# Patient Record
Sex: Male | Born: 1985 | Race: White | Hispanic: No | Marital: Married | Smoking: Never smoker
Health system: Southern US, Community
[De-identification: ages and names within clinical notes are randomized; demographics above are authoritative.]

## PROBLEM LIST (undated history)

## (undated) DIAGNOSIS — T8859XA Other complications of anesthesia, initial encounter: Secondary | ICD-10-CM

## (undated) DIAGNOSIS — F909 Attention-deficit hyperactivity disorder, unspecified type: Secondary | ICD-10-CM

## (undated) DIAGNOSIS — T4145XA Adverse effect of unspecified anesthetic, initial encounter: Secondary | ICD-10-CM

## (undated) DIAGNOSIS — E785 Hyperlipidemia, unspecified: Secondary | ICD-10-CM

## (undated) HISTORY — PX: FACIAL RECONSTRUCTION SURGERY: SHX631

## (undated) HISTORY — PX: COLONOSCOPY: SHX174

## (undated) HISTORY — PX: WISDOM TOOTH EXTRACTION: SHX21

---

## 2011-12-12 LAB — LIPID PANEL
CHOLESTEROL: 217 mg/dL — AB (ref 0–200)
HDL: 43 mg/dL (ref 35–70)
LDL CALC: 158 mg/dL
Triglycerides: 79 mg/dL (ref 40–160)

## 2015-06-23 ENCOUNTER — Ambulatory Visit (INDEPENDENT_AMBULATORY_CARE_PROVIDER_SITE_OTHER): Payer: 59 | Admitting: Family Medicine

## 2015-06-23 ENCOUNTER — Encounter: Payer: Self-pay | Admitting: Family Medicine

## 2015-06-23 VITALS — BP 111/65 | HR 61 | Ht 69.0 in | Wt 183.0 lb

## 2015-06-23 DIAGNOSIS — E785 Hyperlipidemia, unspecified: Secondary | ICD-10-CM

## 2015-06-23 DIAGNOSIS — F909 Attention-deficit hyperactivity disorder, unspecified type: Secondary | ICD-10-CM | POA: Diagnosis not present

## 2015-06-23 MED ORDER — METHYLPHENIDATE HCL ER 27 MG PO TB24: 1.0000 | ORAL_TABLET | Freq: Every day | ORAL | Status: DC

## 2015-06-23 MED ORDER — PRAVASTATIN SODIUM 20 MG PO TABS: 20.0000 mg | ORAL_TABLET | Freq: Every day | ORAL | Status: DC

## 2015-06-23 NOTE — Patient Instructions (Signed)
Thank you for coming in today. Return in 3 months for recheck and fasting labs.  Good luck at work.   Attention Deficit Hyperactivity Disorder Attention deficit hyperactivity disorder (ADHD) is a problem with behavior issues based on the way the brain functions (neurobehavioral disorder). It is a common reason for behavior and academic problems in school. SYMPTOMS  There are 3 types of ADHD. The 3 types and some of the symptoms include:  Inattentive.  Gets bored or distracted easily.  Loses or forgets things. Forgets to hand in homework.  Has trouble organizing or completing tasks.  Difficulty staying on task.  An inability to organize daily tasks and school work.  Leaving projects, chores, or homework unfinished.  Trouble paying attention or responding to details. Careless mistakes.  Difficulty following directions. Often seems like is not listening.  Dislikes activities that require sustained attention (like chores or homework).  Hyperactive-impulsive.  Feels like it is impossible to sit still or stay in a seat. Fidgeting with hands and feet.  Trouble waiting turn.  Talking too much or out of turn. Interruptive.  Speaks or acts impulsively.  Aggressive, disruptive behavior.  Constantly busy or on the go; noisy.  Often leaves seat when they are expected to remain seated.  Often runs or climbs where it is not appropriate, or feels very restless.  Combined.  Has symptoms of both of the above. Often children with ADHD feel discouraged about themselves and with school. They often perform well below their abilities in school. As children get older, the excess motor activities can calm down, but the problems with paying attention and staying organized persist. Most children do not outgrow ADHD but with good treatment can learn to cope with the symptoms. DIAGNOSIS  When ADHD is suspected, the diagnosis should be made by professionals trained in ADHD. This professional  will collect information about the individual suspected of having ADHD. Information must be collected from various settings where the person lives, works, or attends school.  Diagnosis will include:  Confirming symptoms began in childhood.  Ruling out other reasons for the child's behavior.  The health care providers will check with the child's school and check their medical records.  They will talk to teachers and parents.  Behavior rating scales for the child will be filled out by those dealing with the child on a daily basis. A diagnosis is made only after all information has been considered. TREATMENT  Treatment usually includes behavioral treatment, tutoring or extra support in school, and stimulant medicines. Because of the way a person's brain works with ADHD, these medicines decrease impulsivity and hyperactivity and increase attention. This is different than how they would work in a person who does not have ADHD. Other medicines used include antidepressants and certain blood pressure medicines. Most experts agree that treatment for ADHD should address all aspects of the person's functioning. Along with medicines, treatment should include structured classroom management at school. Parents should reward good behavior, provide constant discipline, and set limits. Tutoring should be available for the child as needed. ADHD is a lifelong condition. If untreated, the disorder can have long-term serious effects into adolescence and adulthood. HOME CARE INSTRUCTIONS   Often with ADHD there is a lot of frustration among family members dealing with the condition. Blame and anger are also feelings that are common. In many cases, because the problem affects the family as a whole, the entire family may need help. A therapist can help the family find better ways to handle  the disruptive behaviors of the person with ADHD and promote change. If the person with ADHD is young, most of the therapist's work  is with the parents. Parents will learn techniques for coping with and improving their child's behavior. Sometimes only the child with the ADHD needs counseling. Your health care providers can help you make these decisions.  Children with ADHD may need help learning how to organize. Some helpful tips include:  Keep routines the same every day from wake-up time to bedtime. Schedule all activities, including homework and playtime. Keep the schedule in a place where the person with ADHD will often see it. Mark schedule changes as far in advance as possible.  Schedule outdoor and indoor recreation.  Have a place for everything and keep everything in its place. This includes clothing, backpacks, and school supplies.  Encourage writing down assignments and bringing home needed books. Work with your child's teachers for assistance in organizing school work.  Offer your child a well-balanced diet. Breakfast that includes a balance of whole grains, protein, and fruits or vegetables is especially important for school performance. Children should avoid drinks with caffeine including:  Soft drinks.  Coffee.  Tea.  However, some older children (adolescents) may find these drinks helpful in improving their attention. Because it can also be common for adolescents with ADHD to become addicted to caffeine, talk with your health care provider about what is a safe amount of caffeine intake for your child.  Children with ADHD need consistent rules that they can understand and follow. If rules are followed, give small rewards. Children with ADHD often receive, and expect, criticism. Look for good behavior and praise it. Set realistic goals. Give clear instructions. Look for activities that can foster success and self-esteem. Make time for pleasant activities with your child. Give lots of affection.  Parents are their children's greatest advocates. Learn as much as possible about ADHD. This helps you become a  stronger and better advocate for your child. It also helps you educate your child's teachers and instructors if they feel inadequate in these areas. Parent support groups are often helpful. A national group with local chapters is called Children and Adults with Attention Deficit Hyperactivity Disorder (CHADD). SEEK MEDICAL CARE IF:  Your child has repeated muscle twitches, cough, or speech outbursts.  Your child has sleep problems.  Your child has a marked loss of appetite.  Your child develops depression.  Your child has new or worsening behavioral problems.  Your child develops dizziness.  Your child has a racing heart.  Your child has stomach pains.  Your child develops headaches. SEEK IMMEDIATE MEDICAL CARE IF:  Your child has been diagnosed with depression or anxiety and the symptoms seem to be getting worse.  Your child has been depressed and suddenly appears to have increased energy or motivation.  You are worried that your child is having a bad reaction to a medication he or she is taking for ADHD. Document Released: 10/26/2002 Document Revised: 11/10/2013 Document Reviewed: 07/13/2013 [facility] Patient Information 2015 [place], [place]. This information is not intended to replace advice given to you by your health care provider. Make sure you discuss any questions you have with your health care provider.

## 2015-06-23 NOTE — Progress Notes (Signed)
Justin Carrillo is a 29 y.o. male who presents to [place]  today for establish care discuss ADHD and lipids. 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 He has a medical history significant for ADHD and mild dyslipidemia. He takes methylphenidate ER 27 mg daily and lovastatin 20 mg daily.  1) ADHD: Doing well with methylphenidate. Previously was previously cared for in [place]. He notes the methylphenidate helps him concentrate at work. He denies any adverse effects such as chest pain palpitations or shortness of breath.  2) cholesterol: Patient has a history of elevated cholesterol despite diet and exercise. He notes a family history for vascular disease and dyslipidemia. He states that lovastatin has helped however it is a cause muscle aching. He does not have any muscle aching currently.     History reviewed. No pertinent past medical history. History reviewed. No pertinent past surgical history. History  Substance Use Topics  . Smoking status: Never Smoker   . Smokeless tobacco: Not on file  . Alcohol Use: 0.0 oz/week    0 Standard drinks or equivalent per week   ROS as above Medications: Current Outpatient Prescriptions  Medication Sig Dispense Refill  . Methylphenidate HCl ER 27 MG TB24 Take 1 tablet by mouth daily. Fill today 30 tablet 0  . Methylphenidate HCl ER 27 MG TB24 Take 1 tablet by mouth daily. Fill in 30 days 30 tablet 0  . Methylphenidate HCl ER 27 MG TB24 Take 1 tablet by mouth daily. Fill in 60 days 30 tablet 0  . pravastatin (PRAVACHOL) 20 MG tablet Take 1 tablet (20 mg total) by mouth daily. 90 tablet 3   No current facility-administered medications for this visit.   No Known Allergies   Exam:  BP 111/65 mmHg  Pulse 61  Ht 5\' 9"  (1.753 m)  Wt 183 lb (83.008 kg)  BMI 27.01 kg/m2 Gen: Well NAD HEENT: EOMI,  MMM Lungs: Normal work of  breathing. CTABL Heart: RRR no MRG Abd: NABS, Soft. Nondistended, Nontender Exts: Brisk capillary refill, warm and well perfused.   No results found for this or any previous visit (from the past 24 hour(s)). No results found.   Please see individual assessment and plan sections.

## 2015-06-23 NOTE — Assessment & Plan Note (Signed)
Fill methylphenidate for 3 months. Return for check in 3 months.

## 2015-06-23 NOTE — Assessment & Plan Note (Signed)
Check fasting labs in 3 months to recheck. Switch to pravastatin.

## 2015-09-23 ENCOUNTER — Ambulatory Visit (INDEPENDENT_AMBULATORY_CARE_PROVIDER_SITE_OTHER): Payer: 59 | Admitting: Family Medicine

## 2015-09-23 ENCOUNTER — Encounter: Payer: Self-pay | Admitting: Family Medicine

## 2015-09-23 VITALS — BP 115/66 | HR 54 | Wt 177.0 lb

## 2015-09-23 DIAGNOSIS — E785 Hyperlipidemia, unspecified: Secondary | ICD-10-CM

## 2015-09-23 DIAGNOSIS — F909 Attention-deficit hyperactivity disorder, unspecified type: Secondary | ICD-10-CM

## 2015-09-23 LAB — LIPID PANEL
Cholesterol: 161 mg/dL (ref 125–200)
HDL: 40 mg/dL (ref >=40)
LDL Cholesterol: 106 mg/dL (ref <130)
TRIGLYCERIDES: 74 mg/dL (ref <150)
Total CHOL/HDL Ratio: 4 Ratio (ref <=5.0)
VLDL: 15 mg/dL (ref <30)

## 2015-09-23 LAB — COMPREHENSIVE METABOLIC PANEL
ALK PHOS: 64 U/L (ref 40–115)
ALT: 17 U/L (ref 9–46)
AST: 18 U/L (ref 10–40)
Albumin: 4.3 g/dL (ref 3.6–5.1)
BUN: 17 mg/dL (ref 7–25)
CO2: 30 mmol/L (ref 20–31)
Calcium: 9.5 mg/dL (ref 8.6–10.3)
Chloride: 106 mmol/L (ref 98–110)
Creat: 1.05 mg/dL (ref 0.60–1.35)
Glucose, Bld: 85 mg/dL (ref 65–99)
Potassium: 4.4 mmol/L (ref 3.5–5.3)
Sodium: 142 mmol/L (ref 135–146)
Total Bilirubin: 0.3 mg/dL (ref 0.2–1.2)
Total Protein: 6.6 g/dL (ref 6.1–8.1)

## 2015-09-23 LAB — CK: CK TOTAL: 88 U/L (ref 7–232)

## 2015-09-23 MED ORDER — METHYLPHENIDATE HCL ER 27 MG PO TB24: 1.0000 | ORAL_TABLET | Freq: Every day | ORAL | Status: DC

## 2015-09-23 NOTE — Patient Instructions (Signed)
Thank you for coming in today. Get Labs today.  Return in 3 months.

## 2015-09-23 NOTE — Assessment & Plan Note (Signed)
Recheck lipid panel CMP and CK today. If well recheck 1 year.

## 2015-09-23 NOTE — Assessment & Plan Note (Signed)
Doing well. Refilled medicines for 3 months

## 2015-09-23 NOTE — Progress Notes (Signed)
Justin Carrillo is a 29 y.o. male who presents to [facility]  today for follow-up ADHD and lipids.  1) ADHD: Well managed with methylphenidate. Patient feels well with no side effects from the medications.  2) lipids: Patient has familial dyslipidemia. He was switched from lovastatin to pravastatin at the last visit as he had some myalgias with lovastatin. Since the switch she has done well with no muscle pain.   No past medical history on file. No past surgical history on file. Social History  Substance Use Topics  . Smoking status: Never Smoker   . Smokeless tobacco: Not on file  . Alcohol Use: 0.0 oz/week    0 Standard drinks or equivalent per week   family history includes Alcohol abuse in his father and maternal grandfather; Cancer in his paternal grandfather.  ROS as above Medications: Current Outpatient Prescriptions  Medication Sig Dispense Refill  . Methylphenidate HCl ER 27 MG TB24 Take 1 tablet by mouth daily. Fill today 30 tablet 0  . Methylphenidate HCl ER 27 MG TB24 Take 1 tablet by mouth daily. Fill in 30 days 30 tablet 0  . Methylphenidate HCl ER 27 MG TB24 Take 1 tablet by mouth daily. Fill in 60 days 30 tablet 0  . pravastatin (PRAVACHOL) 20 MG tablet Take 1 tablet (20 mg total) by mouth daily. 90 tablet 3   No current facility-administered medications for this visit.   No Known Allergies   Exam:  BP 115/66 mmHg  Pulse 54  Wt 177 lb (80.287 kg) Gen: Well NAD HEENT: EOMI,  MMM Lungs: Normal work of breathing. CTABL Heart: RRR no MRG Abd: NABS, Soft. Nondistended, Nontender Exts: Brisk capillary refill, warm and well perfused.  Psych: Alert and oriented normal affect speech and thought process. No tremor  No results found for this or any previous visit (from the past 24 hour(s)). No results found.   Please see individual assessment and plan sections.

## 2015-09-25 NOTE — Progress Notes (Signed)
Quick Note:  Labs are great ______

## 2015-10-12 ENCOUNTER — Encounter: Payer: Self-pay | Admitting: Emergency Medicine

## 2015-12-02 MED FILL — METHYLPHENIDATE ER 27 MG TA: 27 | 30 days supply | Qty: 30 | Fill #0

## 2016-05-02 ENCOUNTER — Ambulatory Visit (INDEPENDENT_AMBULATORY_CARE_PROVIDER_SITE_OTHER): Payer: 59 | Admitting: Family Medicine

## 2016-05-02 ENCOUNTER — Encounter: Payer: Self-pay | Admitting: Family Medicine

## 2016-05-02 VITALS — BP 115/74 | HR 58 | Wt 174.0 lb

## 2016-05-02 DIAGNOSIS — R1084 Generalized abdominal pain: Secondary | ICD-10-CM

## 2016-05-02 DIAGNOSIS — R109 Unspecified abdominal pain: Secondary | ICD-10-CM | POA: Insufficient documentation

## 2016-05-02 NOTE — Patient Instructions (Signed)
Thank you for coming in today. Get labs.  Do three day home test for blood.  We will call with results.   If your belly pain worsens, or you have high fever, bad vomiting, blood in your stool or black tarry stool go to the Emergency Room.

## 2016-05-02 NOTE — Progress Notes (Signed)
       Justin Carrillo is a 30 y.o. male who presents to [facility] [place] today for abdominal pain blood in stool. Patient notes cramping abdominal pain and occasional blood in the stool. He's had this happen previously and had a negative colonoscopy about 8 years ago. He denies any fevers or chills or weight loss. He feels well currently. He notes that he no longer is taking the methylphenidate for ADHD in doing pretty well without it. He is currently taking pravastatin and feels well with the muscle pains or aches.   No past medical history on file. No past surgical history on file. Social History  Substance Use Topics  . Smoking status: Never Smoker   . Smokeless tobacco: Not on file  . Alcohol Use: 0.0 oz/week    0 Standard drinks or equivalent per week   family history includes Alcohol abuse in his father and maternal grandfather; Cancer in his paternal grandfather.  ROS as above:  Medications: Current Outpatient Prescriptions  Medication Sig Dispense Refill  . pravastatin (PRAVACHOL) 20 MG tablet Take 1 tablet (20 mg total) by mouth daily. 90 tablet 3  . Methylphenidate HCl ER 27 MG TB24 Take 1 tablet by mouth daily. Fill today (Patient not taking: Reported on 05/02/2016) 30 tablet 0   No current facility-administered medications for this visit.   No Known Allergies   Exam:  BP 115/74 mmHg  Pulse 58  Wt 174 lb (78.926 kg) Gen: Well NAD HEENT: EOMI,  MMM Lungs: Normal work of breathing. CTABL Heart: RRR no MRG Abd: NABS, Soft. Nondistended, Nontender Exts: Brisk capillary refill, warm and well perfused.  Rectal: Normal-appearing anus. Rectal exam nontender with no masses. No stool in the vault.  Point-of-care Hemoccult negative  No results found for this or any previous visit (from the past 24 hour(s)). No results found.    Assessment and Plan: 30 y.o. male  with abdominal pain. Unclear etiology. Plan for basic laboratory workup with CBC CMP and lipase. Additionally he was sent home with a three-day home stool test for blood. Will call patient with results. If positive would consider referral to GI for possible colonoscopy.  Discussed warning signs or symptoms. Please see discharge instructions. Patient expresses understanding.

## 2016-05-03 LAB — COMPREHENSIVE METABOLIC PANEL
ALK PHOS: 55 U/L (ref 40–115)
ALT: 17 U/L (ref 9–46)
AST: 18 U/L (ref 10–40)
Albumin: 4.7 g/dL (ref 3.6–5.1)
BILIRUBIN TOTAL: 0.4 mg/dL (ref 0.2–1.2)
BUN: 14 mg/dL (ref 7–25)
CO2: 20 mmol/L (ref 20–31)
CREATININE: 0.99 mg/dL (ref 0.60–1.35)
Calcium: 9.7 mg/dL (ref 8.6–10.3)
Chloride: 104 mmol/L (ref 98–110)
GLUCOSE: 91 mg/dL (ref 65–99)
POTASSIUM: 4.3 mmol/L (ref 3.5–5.3)
Sodium: 140 mmol/L (ref 135–146)
TOTAL PROTEIN: 7 g/dL (ref 6.1–8.1)

## 2016-05-03 LAB — CBC
HCT: 44.1 % (ref 38.5–50.0)
Hemoglobin: 14.9 g/dL (ref 13.2–17.1)
MCH: 30.8 pg (ref 27.0–33.0)
MCHC: 33.8 g/dL (ref 32.0–36.0)
MCV: 91.1 fL (ref 80.0–100.0)
MPV: 8.4 fL (ref 7.5–12.5)
PLATELETS: 301 10*3/uL (ref 140–400)
RBC: 4.84 MIL/uL (ref 4.20–5.80)
RDW: 13.5 % (ref 11.0–15.0)
WBC: 4 10*3/uL (ref 3.8–10.8)

## 2016-05-03 LAB — LIPASE

## 2016-05-04 NOTE — Progress Notes (Signed)
Quick Note:  Labs look normal ______

## 2016-11-20 ENCOUNTER — Telehealth: Payer: 59 | Admitting: Family

## 2016-11-20 DIAGNOSIS — R05 Cough: Secondary | ICD-10-CM

## 2016-11-20 DIAGNOSIS — J209 Acute bronchitis, unspecified: Secondary | ICD-10-CM | POA: Diagnosis not present

## 2016-11-20 DIAGNOSIS — R059 Cough, unspecified: Secondary | ICD-10-CM

## 2016-11-20 MED ORDER — AZITHROMYCIN 250 MG PO TABS: 250.0000 mg | ORAL_TABLET | Freq: Every day | ORAL | 0 refills | Status: DC

## 2016-11-20 NOTE — Progress Notes (Signed)

## 2017-02-11 ENCOUNTER — Emergency Department (INDEPENDENT_AMBULATORY_CARE_PROVIDER_SITE_OTHER): Payer: 59

## 2017-02-11 ENCOUNTER — Encounter: Payer: Self-pay | Admitting: *Deleted

## 2017-02-11 ENCOUNTER — Emergency Department (INDEPENDENT_AMBULATORY_CARE_PROVIDER_SITE_OTHER)
Admission: EM | Admit: 2017-02-11 | Discharge: 2017-02-11 | Disposition: A | Discharge status: Home / Self Care | Payer: 59 | Source: Home / Self Care | Attending: Family Medicine | Admitting: Family Medicine

## 2017-02-11 DIAGNOSIS — K4090 Unilateral inguinal hernia, without obstruction or gangrene, not specified as recurrent: Secondary | ICD-10-CM

## 2017-02-11 HISTORY — DX: Attention-deficit hyperactivity disorder, unspecified type: F90.9

## 2017-02-11 LAB — POCT URINALYSIS DIP (MANUAL ENTRY)
BILIRUBIN UA: NEGATIVE
Bilirubin, UA: NEGATIVE
Blood, UA: NEGATIVE
Glucose, UA: NEGATIVE
LEUKOCYTES UA: NEGATIVE
Nitrite, UA: NEGATIVE
PROTEIN UA: NEGATIVE
Spec Grav, UA: 1.01 (ref 1.030–1.035)
Urobilinogen, UA: 0.2 (ref ?–2.0)
pH, UA: 6.5 (ref 5.0–8.0)

## 2017-02-11 NOTE — Discharge Instructions (Addendum)
Avoid lifting, pushing, pulling. For pain, may take Ibuprofen 200mg , 4 tabs every 8 hours with food.

## 2017-02-11 NOTE — ED Provider Notes (Signed)
[facility]    CSN: [id number] Arrival date & time: 02/11/17  0841     History   Chief Complaint Chief Complaint  Patient presents with  . Groin Pain    HPI Justin Carrillo is a 31 y.o. male.   Three days ago patient developed vague mild right groin pain.  The pain has persisted and gradually become worse.  The pain is intermittent, and worse when he flexes his right hip.  He recalls no injury.  No GI or GU symptoms.  He feels well otherwise. He had similar right inguinal pain several years ago that resolved spontaneously.   The history is provided by the patient.  Groin Pain  This is a new problem. The current episode started more than 2 days ago. The problem occurs daily. The problem has been gradually worsening. Pertinent negatives include no abdominal pain. Exacerbated by: flexing right hip. Nothing relieves the symptoms. He has tried nothing for the symptoms.    Past Medical History:  Diagnosis Date  . ADHD     Patient Active Problem List   Diagnosis Date Noted  . Abdominal pain 05/02/2016  . ADHD (attention deficit hyperactivity disorder) 06/23/2015  . Dyslipidemia 06/23/2015    History reviewed. No pertinent surgical history.     Home Medications    Prior to Admission medications   Not on File    Family History Family History  Problem Relation Age of Onset  . Alcohol abuse Father   . Deep vein thrombosis Father   . Stroke Father   . Heart attack Father   . Alcohol abuse Maternal Grandfather   . Cancer Paternal Grandfather   . Diabetes Mother     Social History Social History  Substance Use Topics  . Smoking status: Never Smoker  . Smokeless tobacco: Never Used  . Alcohol use 0.0 oz/week     Allergies   Patient has no known allergies.   Review of Systems Review of Systems  Gastrointestinal: Negative for abdominal pain.  All other systems reviewed and are negative.    Physical Exam Triage Vital Signs ED Triage Vitals    Enc Vitals Group     BP 02/11/17 0901 123/86     Pulse Rate 02/11/17 0901 (!) 56     Resp --      Temp 02/11/17 0901 97.6 F (36.4 C)     Temp Source 02/11/17 0901 Oral     SpO2 02/11/17 0901 97 %     Weight 02/11/17 0901 182 lb (82.6 kg)     Height --      Head Circumference --      Peak Flow --      Pain Score 02/11/17 0903 3     Pain Loc --      Pain Edu? --      Excl. in [place]? --    No data found.   Updated Vital Signs BP 123/86 (BP Location: Left Arm)   Pulse (!) 56   Temp 97.6 F (36.4 C) (Oral)   Wt 182 lb (82.6 kg)   SpO2 97%   BMI 26.88 kg/m   Visual Acuity Right Eye Distance:   Left Eye Distance:   Bilateral Distance:    Right Eye Near:   Left Eye Near:    Bilateral Near:     Physical Exam  Constitutional: He appears well-developed and well-nourished. No distress.  HENT:  Head: Normocephalic.  Nose: Nose normal.  Mouth/Throat: Oropharynx is clear and moist.  Eyes: Pupils are equal, round, and reactive to light.  Neck: Neck supple.  Cardiovascular: Normal heart sounds.   Pulmonary/Chest: Breath sounds normal.  Abdominal: Soft. He exhibits no mass. There is no tenderness. A hernia is present. Hernia confirmed positive in the right inguinal area.  Genitourinary: Testes normal and penis normal. Cremasteric reflex is present. Right testis shows no mass, no swelling and no tenderness. Left testis shows no mass, no swelling and no tenderness.     Genitourinary Comments: Right inguinal canal tender to palpation, especially with valsalva.  Musculoskeletal: He exhibits no edema.  Lymphadenopathy:    He has no cervical adenopathy.  Neurological: He is alert.  Skin: Skin is warm and dry. No rash noted.  Nursing note and vitals reviewed.    UC Treatments / Results  Labs (all labs ordered are listed, but only abnormal results are displayed) Labs Reviewed  POCT URINALYSIS DIP (MANUAL ENTRY) negative    EKG  EKG Interpretation None        Radiology Study Result   CLINICAL DATA:  Three-day history of right groin pain, worse with sitting  EXAM: LIMITED ULTRASOUND OF PELVIS/ULTRASOUND RIGHT INGUINAL REGION  TECHNIQUE: Longitudinal and transverse images of the right inguinal region obtained.  COMPARISON:  None.  FINDINGS: There is localized bowel in the right inguinal region, felt to most likely represent bowel within a small right inguinal hernia. There is no mass or inflammatory focus in this area. No adenopathy or abnormal fluid. Study otherwise appears unremarkable.  IMPRESSION: Bowel is noted in the right inguinal region, moving with Valsalva maneuver. It is felt that there is a small right inguinal hernia containing bowel. Study otherwise unremarkable.  As clinically indicated, CT of the pelvis with oral contrast with particular attention to the right inguinal region could be helpful for more precise delineation of this area.   Electronically Signed   By: Lowella Grip III M.D.   On: 02/11/2017 10:46     Procedures Procedures (including critical care time)  Medications Ordered in UC Medications - No data to display   Initial Impression / Assessment and Plan / UC Course  I have reviewed the triage vital signs and the nursing notes.  Pertinent labs & imaging results that were available during my care of the patient were reviewed by me and considered in my medical decision making (see chart for details).    Followup with [facility] as soon as possible.  Avoid lifting, pushing, pulling. For pain, may take Ibuprofen 200mg , 4 tabs every 8 hours with food.  If symptoms become significantly worse during the night or over the weekend, proceed to the local emergency room.    Final Clinical Impressions(s) / UC Diagnoses   Final diagnoses:  Right inguinal hernia    New Prescriptions There are no discharge medications for this patient.    Kandra Nicolas,  MD 02/24/17 2044

## 2017-02-11 NOTE — ED Triage Notes (Signed)
Patient c/o 1 1/2 days of right groin pain with gradual onset. Denies any urinary abnormalities. No lumps or bulges at the site. Pain is intermittent.

## 2017-02-19 DIAGNOSIS — K4090 Unilateral inguinal hernia, without obstruction or gangrene, not specified as recurrent: Secondary | ICD-10-CM | POA: Diagnosis not present

## 2017-04-08 ENCOUNTER — Ambulatory Visit: Payer: Self-pay | Admitting: General Surgery

## 2017-04-08 DIAGNOSIS — K4090 Unilateral inguinal hernia, without obstruction or gangrene, not specified as recurrent: Secondary | ICD-10-CM | POA: Diagnosis not present

## 2017-05-07 DIAGNOSIS — H52223 Regular astigmatism, bilateral: Secondary | ICD-10-CM | POA: Diagnosis not present

## 2017-05-07 DIAGNOSIS — H5213 Myopia, bilateral: Secondary | ICD-10-CM | POA: Diagnosis not present

## 2017-05-13 NOTE — Pre-Procedure Instructions (Addendum)
CHRISTOFER SHEN  05/13/2017      [id number] - [place], [place] - [address] [place] [address] Phone: [phone] Fax: [place], [place] - [address] [place] [address] Phone: [phone] Fax: [phone]    Your procedure is scheduled on Thursday May 23, 2017.  Report to [facility] at 530 AM.  Call this number if you have problems the morning of surgery:[phone]   Remember:  Do not eat food or drink liquids after midnight, May 22, 2017.  Take these medicines the morning of surgery with A SIP OF WATER acetaminophen (tylenol)-if needed.  7 days prior to surgery STOP taking any Aspirin, Aleve, Naproxen, Ibuprofen, Motrin, Advil, Goody's, BC's, all herbal medications, fish oil, and all vitamins   Do not wear jewelry, make-up or nail polish.  Do not wear lotions, powders, or perfumes, or deoderant.  Do not shave 48 hours prior to surgery.  Men may shave face and neck.  Do not bring valuables to the hospital.  [facility] is not responsible for any belongings or valuables.  Contacts, dentures or bridgework may not be worn into surgery.  Leave your suitcase in the car.  After surgery it may be brought to your room.  For patients admitted to the hospital, discharge time will be determined by your treatment team.  Patients discharged the day of surgery will not be allowed to drive home.   Special instructions:   [place]- Preparing For Surgery  Before surgery, you can play an important role. Because skin is not sterile, your skin needs to be as free of germs as possible. You can reduce the number of germs on your skin by washing with CHG (chlorahexidine gluconate) Soap before surgery.  CHG is an antiseptic cleaner which kills germs and bonds with the skin to continue killing germs even after washing.  Please do not use if you  have an allergy to CHG or antibacterial soaps. If your skin becomes reddened/irritated stop using the CHG.  Do not shave (including legs and underarms) for at least 48 hours prior to first CHG shower. It is OK to shave your face.  Please follow these instructions carefully.   1. Shower the NIGHT BEFORE SURGERY and the MORNING OF SURGERY with CHG.   2. If you chose to wash your hair, wash your hair first as usual with your normal shampoo.  3. After you shampoo, rinse your hair and body thoroughly to remove the shampoo.  4. Use CHG as you would any other liquid soap. You can apply CHG directly to the skin and wash gently with a scrungie or a clean washcloth.   5. Apply the CHG Soap to your body ONLY FROM THE NECK DOWN.  Do not use on open wounds or open sores. Avoid contact with your eyes, ears, mouth and genitals (private parts). Wash genitals (private parts) with your normal soap.  6. Wash thoroughly, paying special attention to the area where your surgery will be performed.  7. Thoroughly rinse your body with warm water from the neck down.  8. DO NOT shower/wash with your normal soap after using and rinsing off the CHG Soap.  9. Pat yourself dry with a CLEAN TOWEL.   10. Wear CLEAN PAJAMAS   11. Place CLEAN SHEETS on your bed the night of your first shower and DO NOT SLEEP WITH PETS.  Day of Surgery: Do not apply any deodorants/lotions. Please wear clean clothes to the hospital/surgery center.     Please read over the following fact sheets that you were given. Pain Booklet, Coughing and Deep Breathing and Surgical Site Infection Prevention

## 2017-05-14 ENCOUNTER — Encounter (HOSPITAL_COMMUNITY): Payer: Self-pay

## 2017-05-14 ENCOUNTER — Encounter (HOSPITAL_COMMUNITY)
Admission: RE | Admit: 2017-05-14 | Discharge: 2017-05-14 | Disposition: A | Discharge status: Home / Self Care | Payer: 59 | Source: Ambulatory Visit | Attending: General Surgery | Admitting: General Surgery

## 2017-05-14 DIAGNOSIS — Z01812 Encounter for preprocedural laboratory examination: Secondary | ICD-10-CM | POA: Insufficient documentation

## 2017-05-14 DIAGNOSIS — K4090 Unilateral inguinal hernia, without obstruction or gangrene, not specified as recurrent: Secondary | ICD-10-CM | POA: Diagnosis not present

## 2017-05-14 HISTORY — DX: Other complications of anesthesia, initial encounter: T88.59XA

## 2017-05-14 HISTORY — DX: Adverse effect of unspecified anesthetic, initial encounter: T41.45XA

## 2017-05-14 HISTORY — DX: Hyperlipidemia, unspecified: E78.5

## 2017-05-14 LAB — CBC
HCT: 42.4 % (ref 39.0–52.0)
Hemoglobin: 14.5 g/dL (ref 13.0–17.0)
MCH: 31.3 pg (ref 26.0–34.0)
MCHC: 34.2 g/dL (ref 30.0–36.0)
MCV: 91.6 fL (ref 78.0–100.0)
PLATELETS: 259 10*3/uL (ref 150–400)
RBC: 4.63 MIL/uL (ref 4.22–5.81)
RDW: 12.5 % (ref 11.5–15.5)
WBC: 6 10*3/uL (ref 4.0–10.5)

## 2017-05-23 ENCOUNTER — Ambulatory Visit (HOSPITAL_COMMUNITY)
Admission: RE | Admit: 2017-05-23 | Discharge: 2017-05-23 | Disposition: A | Discharge status: Home / Self Care | Payer: 59 | Source: Ambulatory Visit | Attending: General Surgery | Admitting: General Surgery

## 2017-05-23 ENCOUNTER — Encounter (HOSPITAL_COMMUNITY)
Admission: RE | Disposition: A | Discharge status: Home / Self Care | Payer: Self-pay | Source: Ambulatory Visit | Attending: General Surgery

## 2017-05-23 ENCOUNTER — Ambulatory Visit (HOSPITAL_COMMUNITY): Payer: 59 | Admitting: Certified Registered"

## 2017-05-23 DIAGNOSIS — K4090 Unilateral inguinal hernia, without obstruction or gangrene, not specified as recurrent: Secondary | ICD-10-CM | POA: Insufficient documentation

## 2017-05-23 DIAGNOSIS — E785 Hyperlipidemia, unspecified: Secondary | ICD-10-CM | POA: Diagnosis not present

## 2017-05-23 DIAGNOSIS — F909 Attention-deficit hyperactivity disorder, unspecified type: Secondary | ICD-10-CM | POA: Diagnosis not present

## 2017-05-23 DIAGNOSIS — D176 Benign lipomatous neoplasm of spermatic cord: Secondary | ICD-10-CM | POA: Diagnosis not present

## 2017-05-23 DIAGNOSIS — R109 Unspecified abdominal pain: Secondary | ICD-10-CM | POA: Diagnosis not present

## 2017-05-23 HISTORY — PX: INSERTION OF MESH: SHX5868

## 2017-05-23 HISTORY — PX: INGUINAL HERNIA REPAIR: SHX194

## 2017-05-23 SURGERY — REPAIR, HERNIA, INGUINAL, LAPAROSCOPIC
Anesthesia: General | Site: Groin | Laterality: Right

## 2017-05-23 MED ORDER — SCOPOLAMINE 1 MG/3DAYS TD PT72
MEDICATED_PATCH | TRANSDERMAL | Status: AC
  Filled 2017-05-23: qty 1

## 2017-05-23 MED ORDER — SCOPOLAMINE 1 MG/3DAYS TD PT72
MEDICATED_PATCH | TRANSDERMAL | Status: DC | PRN
  Administered 2017-05-23: 1 via TRANSDERMAL

## 2017-05-23 MED ORDER — CEFAZOLIN SODIUM-DEXTROSE 2-4 GM/100ML-% IV SOLN
2.0000 g | INTRAVENOUS | Status: AC
  Administered 2017-05-23: 2 g via INTRAVENOUS
  Filled 2017-05-23: qty 100

## 2017-05-23 MED ORDER — CHLORHEXIDINE GLUCONATE CLOTH 2 % EX PADS: 6.0000 | MEDICATED_PAD | Freq: Once | CUTANEOUS | Status: DC

## 2017-05-23 MED ORDER — VECURONIUM BROMIDE 10 MG IV SOLR
INTRAVENOUS | Status: AC
  Filled 2017-05-23: qty 10

## 2017-05-23 MED ORDER — PROPOFOL 10 MG/ML IV BOLUS
INTRAVENOUS | Status: AC
  Filled 2017-05-23: qty 20

## 2017-05-23 MED ORDER — VECURONIUM BROMIDE 10 MG IV SOLR
INTRAVENOUS | Status: DC | PRN
  Administered 2017-05-23: 1 mg via INTRAVENOUS
  Administered 2017-05-23: 8 mg via INTRAVENOUS

## 2017-05-23 MED ORDER — DEXAMETHASONE SODIUM PHOSPHATE 10 MG/ML IJ SOLN
INTRAMUSCULAR | Status: DC | PRN
  Administered 2017-05-23: 10 mg via INTRAVENOUS

## 2017-05-23 MED ORDER — FENTANYL CITRATE (PF) 100 MCG/2ML IJ SOLN: 25.0000 ug | INTRAMUSCULAR | Status: DC | PRN

## 2017-05-23 MED ORDER — ACETAMINOPHEN 500 MG PO TABS
1000.0000 mg | ORAL_TABLET | ORAL | Status: AC
  Administered 2017-05-23: 1000 mg via ORAL
  Filled 2017-05-23: qty 2

## 2017-05-23 MED ORDER — SUGAMMADEX SODIUM 200 MG/2ML IV SOLN
INTRAVENOUS | Status: DC | PRN
  Administered 2017-05-23: 165 mg via INTRAVENOUS

## 2017-05-23 MED ORDER — EPHEDRINE SULFATE-NACL 50-0.9 MG/10ML-% IV SOSY
PREFILLED_SYRINGE | INTRAVENOUS | Status: DC | PRN
Start: 2017-05-23 — End: 2017-05-23
  Administered 2017-05-23: 5 mg via INTRAVENOUS
  Administered 2017-05-23: 10 mg via INTRAVENOUS

## 2017-05-23 MED ORDER — PHENYLEPHRINE 40 MCG/ML (10ML) SYRINGE FOR IV PUSH (FOR BLOOD PRESSURE SUPPORT)
PREFILLED_SYRINGE | INTRAVENOUS | Status: DC | PRN
  Administered 2017-05-23 (×3): 80 ug via INTRAVENOUS

## 2017-05-23 MED ORDER — VECURONIUM BROMIDE 10 MG IV SOLR: INTRAVENOUS | Status: DC | PRN

## 2017-05-23 MED ORDER — MIDAZOLAM HCL 5 MG/5ML IJ SOLN
INTRAMUSCULAR | Status: DC | PRN
  Administered 2017-05-23: 2 mg via INTRAVENOUS

## 2017-05-23 MED ORDER — BUPIVACAINE-EPINEPHRINE 0.25% -1:200000 IJ SOLN
INTRAMUSCULAR | Status: DC | PRN
  Administered 2017-05-23: 15 mL

## 2017-05-23 MED ORDER — SUGAMMADEX SODIUM 200 MG/2ML IV SOLN
INTRAVENOUS | Status: AC
  Filled 2017-05-23: qty 2

## 2017-05-23 MED ORDER — LIDOCAINE 2% (20 MG/ML) 5 ML SYRINGE
INTRAMUSCULAR | Status: DC | PRN
  Administered 2017-05-23: 60 mg via INTRAVENOUS

## 2017-05-23 MED ORDER — FENTANYL CITRATE (PF) 250 MCG/5ML IJ SOLN
INTRAMUSCULAR | Status: AC
  Filled 2017-05-23: qty 5

## 2017-05-23 MED ORDER — EPHEDRINE 5 MG/ML INJ
INTRAVENOUS | Status: AC
  Filled 2017-05-23: qty 10

## 2017-05-23 MED ORDER — OXYCODONE HCL 5 MG PO TABS
ORAL_TABLET | ORAL | Status: AC
  Filled 2017-05-23: qty 1

## 2017-05-23 MED ORDER — OXYCODONE HCL 5 MG PO TABS
5.0000 mg | ORAL_TABLET | Freq: Once | ORAL | Status: AC | PRN
  Administered 2017-05-23: 5 mg via ORAL

## 2017-05-23 MED ORDER — SODIUM CHLORIDE 0.9 % IJ SOLN
INTRAMUSCULAR | Status: AC
  Filled 2017-05-23: qty 10

## 2017-05-23 MED ORDER — LACTATED RINGERS IV SOLN
INTRAVENOUS | Status: DC | PRN
  Administered 2017-05-23 (×2): via INTRAVENOUS

## 2017-05-23 MED ORDER — OXYCODONE HCL 5 MG/5ML PO SOLN: 5.0000 mg | Freq: Once | ORAL | Status: AC | PRN

## 2017-05-23 MED ORDER — OXYCODONE HCL 5 MG PO TABS
5.0000 mg | ORAL_TABLET | Freq: Four times a day (QID) | ORAL | 0 refills | Status: DC | PRN
Start: 2017-05-23 — End: 2017-12-13

## 2017-05-23 MED ORDER — PHENYLEPHRINE 40 MCG/ML (10ML) SYRINGE FOR IV PUSH (FOR BLOOD PRESSURE SUPPORT)
PREFILLED_SYRINGE | INTRAVENOUS | Status: AC
  Filled 2017-05-23: qty 10

## 2017-05-23 MED ORDER — PROPOFOL 10 MG/ML IV BOLUS
INTRAVENOUS | Status: DC | PRN
Start: 2017-05-23 — End: 2017-05-23
  Administered 2017-05-23: 40 mg via INTRAVENOUS
  Administered 2017-05-23: 160 mg via INTRAVENOUS
  Administered 2017-05-23: 20 mg via INTRAVENOUS

## 2017-05-23 MED ORDER — SODIUM CHLORIDE 0.9 % IV SOLN
INTRAVENOUS | Status: DC | PRN
  Administered 2017-05-23: 500 mL

## 2017-05-23 MED ORDER — LIDOCAINE HCL (CARDIAC) 20 MG/ML IV SOLN
INTRAVENOUS | Status: AC
  Filled 2017-05-23: qty 5

## 2017-05-23 MED ORDER — ONDANSETRON HCL 4 MG/2ML IJ SOLN
INTRAMUSCULAR | Status: AC
  Filled 2017-05-23: qty 2

## 2017-05-23 MED ORDER — BUPIVACAINE-EPINEPHRINE (PF) 0.25% -1:200000 IJ SOLN
INTRAMUSCULAR | Status: AC
  Filled 2017-05-23: qty 30

## 2017-05-23 MED ORDER — FENTANYL CITRATE (PF) 250 MCG/5ML IJ SOLN
INTRAMUSCULAR | Status: DC | PRN
  Administered 2017-05-23 (×2): 100 ug via INTRAVENOUS
  Administered 2017-05-23: 50 ug via INTRAVENOUS

## 2017-05-23 MED ORDER — ONDANSETRON HCL 4 MG/2ML IJ SOLN
INTRAMUSCULAR | Status: DC | PRN
  Administered 2017-05-23: 4 mg via INTRAVENOUS

## 2017-05-23 MED ORDER — GABAPENTIN 300 MG PO CAPS
300.0000 mg | ORAL_CAPSULE | ORAL | Status: AC
  Administered 2017-05-23: 300 mg via ORAL
  Filled 2017-05-23: qty 1

## 2017-05-23 MED ORDER — MIDAZOLAM HCL 2 MG/2ML IJ SOLN
INTRAMUSCULAR | Status: AC
  Filled 2017-05-23: qty 2

## 2017-05-23 MED FILL — oxyCODONE HCL 5 MG TABS: 5 | 4 days supply | Qty: 30 | Fill #0

## 2017-05-23 SURGICAL SUPPLY — 36 items
APPLIER CLIP 5 13 M/L LIGAMAX5 (MISCELLANEOUS)
BAG DECANTER FOR FLEXI CONT (MISCELLANEOUS) ×2 IMPLANT
CHLORAPREP W/TINT 26ML (MISCELLANEOUS) ×2 IMPLANT
CLIP APPLIE 5 13 M/L LIGAMAX5 (MISCELLANEOUS) IMPLANT
CLOSURE STERI-STRIP 1/4X4 (GAUZE/BANDAGES/DRESSINGS) ×2 IMPLANT
COVER SURGICAL LIGHT HANDLE (MISCELLANEOUS) ×2 IMPLANT
DECANTER SPIKE VIAL GLASS SM (MISCELLANEOUS) ×2 IMPLANT
DERMABOND ADVANCED (GAUZE/BANDAGES/DRESSINGS) ×2
DERMABOND ADVANCED .7 DNX12 (GAUZE/BANDAGES/DRESSINGS) ×2 IMPLANT
DEVICE SECURE STRAP 25 ABSORB (INSTRUMENTS) ×2 IMPLANT
DISSECT BALLN SPACEMKR + OVL (BALLOONS) ×2
DISSECTOR BALLN SPACEMKR + OVL (BALLOONS) ×1 IMPLANT
DISSECTOR BLUNT TIP ENDO 5MM (MISCELLANEOUS) IMPLANT
DRSG TEGADERM 2-3/8X2-3/4 SM (GAUZE/BANDAGES/DRESSINGS) ×2 IMPLANT
DRSG TEGADERM 4X4.75 (GAUZE/BANDAGES/DRESSINGS) ×2 IMPLANT
ELECT REM PT RETURN 9FT ADLT (ELECTROSURGICAL) ×2
ELECTRODE REM PT RTRN 9FT ADLT (ELECTROSURGICAL) ×1 IMPLANT
GLOVE BIOGEL PI IND STRL 8 (GLOVE) ×5 IMPLANT
GLOVE BIOGEL PI INDICATOR 8 (GLOVE) ×5
GLOVE ECLIPSE 7.5 STRL STRAW (GLOVE) ×2 IMPLANT
GOWN STRL REUS W/ TWL LRG LVL3 (GOWN DISPOSABLE) ×3 IMPLANT
GOWN STRL REUS W/TWL LRG LVL3 (GOWN DISPOSABLE) ×3
KIT BASIN OR (CUSTOM PROCEDURE TRAY) ×2 IMPLANT
KIT ROOM TURNOVER OR (KITS) ×2 IMPLANT
MESH 3DMAX 3X5 RT MED (Mesh General) ×2 IMPLANT
NS IRRIG 1000ML POUR BTL (IV SOLUTION) ×2 IMPLANT
PAD ARMBOARD 7.5X6 YLW CONV (MISCELLANEOUS) ×2 IMPLANT
SET TROCAR LAP APPLE-HUNT 5MM (ENDOMECHANICALS) ×2 IMPLANT
STRIP CLOSURE SKIN 1/2X4 (GAUZE/BANDAGES/DRESSINGS) ×2 IMPLANT
SUT MNCRL AB 4-0 PS2 18 (SUTURE) ×2 IMPLANT
TACKER 5MM HERNIA 3.5CML NAB (ENDOMECHANICALS) ×2 IMPLANT
TOWEL OR 17X24 6PK STRL BLUE (TOWEL DISPOSABLE) ×2 IMPLANT
TOWEL OR 17X26 10 PK STRL BLUE (TOWEL DISPOSABLE) ×2 IMPLANT
TRAY FOLEY CATH SILVER 14FR (SET/KITS/TRAYS/PACK) IMPLANT
TRAY LAPAROSCOPIC MC (CUSTOM PROCEDURE TRAY) ×2 IMPLANT
TUBING INSUFFLATION (TUBING) ×2 IMPLANT

## 2017-05-23 NOTE — Op Note (Signed)
OPERATIVE REPORT  DATE OF OPERATION: 05/23/2017  PATIENT:  Justin Carrillo  31 y.o. male  PRE-OPERATIVE DIAGNOSIS:  Symptomaic right inguinal hernia  POST-OPERATIVE DIAGNOSIS:  Symptomaic right inguinal hernia  INDICATION(S) FOR OPERATION:  Symptomatic RIH  FINDINGS:  Small direct defect with small indirect sac not resected.  Lipoma of the cord  PROCEDURE:  Procedure(s): LAPAROSCOPIC RIGHT INGUINAL HERNIA REPAIR WITH MESH INSERTION OF MESH  SURGEON:  Surgeon(s): Judeth Horn, MD  ASSISTANT: None  ANESTHESIA:   general  COMPLICATIONS:  None  EBL: <20 ml  BLOOD ADMINISTERED: none  DRAINS: none   SPECIMEN:  No Specimen  COUNTS CORRECT:  YES  PROCEDURE DETAILS: The patient was taken to the operating room and placed on the table in supine position. After an adequate general endotracheal anesthetic was administered, he was prepped and draped in usual sterile manner exposing mostly the right side of the abdomen and the right inguinal area.  A proper timeout was performed identifying the patient and procedure to be performed. A right paraumbilical transverse incision was made approximately a centimeter long and taken down to subcutaneous tissue. We incised the anterior rectus fascia transversely then dissected down to the posterior sheath using blunt dissection and a Kelly clamp. It was in that plane that the dissecting balloon was passed along with the attached bulb and insufflated under direct vision with the laparoscope and attached camera and light source.  We insufflated the dissecting balloon to its fullest extent than retrieved it and subsequently insufflated carbon dioxide gas into the preperitoneal space through the main paraumbilical trocar. With a dissecting trocar in place we passed two 5 mm cannulas in place after direct vision into preperitoneal space .  We used as blunt dissector to dissect out spermatic cord and the very small indirect sac attached to the base of the  spermatic cord. This is dissected back but not open into the peritoneal cavity. There was a lipoma the cord that was attached to the cord laterally and we dissected free.  Also the direct defect be seen up near the exit site of the cord and this is covered with the 3-D Bard mesh, medium size, for right inguinal hernias.  The mesh was soaked in antibiotic solution prior to being placed. The original Secure Strap tacker that was used to fixate the mesh did not hold the tissues well therefore we used a Protractor fixating device, fixation device, which worked well. Lateral tacks were not placed. Once it was in place we allowed all gas to escape out of the cannulas in the pre-from the preperitoneal space. We then closed. 0.25% Marcaine with epinephrine was injected at all sites. The fascia of the anterior rectus sheath was closed using a figure-of-eight stitch of 0 Vicryl. The skin at the same site was closed using running subcuticular stitch of 4-0 Monocryl.  All needle counts, sponge counts, and instrument counts were correct. Dermabond, Steri-Strips, and Tegaderms are used to complete our dressings.  PATIENT DISPOSITION:  PACU - hemodynamically stable.   Jossilyn Benda 7/5/20189:05 AM

## 2017-05-23 NOTE — Progress Notes (Signed)
Per Dr. Hulen Skains as clarified by Elta Guadeloupe RN, patient does not need to void before discharge

## 2017-05-23 NOTE — H&P (Signed)
  Justin Carrillo 04/08/2017 2:46 PM Location: [facility] Patient #: [id number] DOB: 1985/12/26 Married / Language: Undefined / Race: White Male   History of Present Illness Kathryne Eriksson. Hulen Skains MD; 04/08/2017 3:14 PM) The patient is a 31 year old male who presents with an inguinal hernia. The hernia(s) is/are located on the right side (Previously diagnosed and examined on the right side. Developed some left inguinal tenderness. Wanted that rechecked today.).   Allergies Dalbert Mayotte, [place]; 04/08/2017 2:47 PM) No Known Drug Allergies 04/08/2017  Vitals Dalbert Mayotte CMA; 04/08/2017 2:48 PM) 04/08/2017 2:47 PM Weight: 183 lb Height: 71in Body Surface Area: 2.03 m Body Mass Index: 25.52 kg/m  Temp.: 98.18F  Pulse: 70 (Regular)  BP: 110/80 (Sitting, Left Arm, Standard) BP 96/64, P 52  Physical Exam (Alvera Tourigny O. Othella Slappey MD; 04/08/2017 3:15 PM) Abdomen Inspection Hernias - Inguinal hernia - Right - Reducible(Small and only palpable on digital exmination. None palpated on the left side.).  Did not re-examine the patient.  Marked on the right side for hernia repair laparoscopically. Palpation/Percussion Palpation and Percussion of the abdomen reveal - Soft, Non Tender and No Rebound tenderness. Auscultation Auscultation of the abdomen reveals - Bowel sounds normal.    Assessment & Plan Jeneen Rinks O. Denyla Cortese MD; 04/08/2017 3:17 PM) RIGHT INGUINAL HERNIA (K40.90) Story: Right groin and scrotal discomfort. Ultrasound done demonstrating hernia Impression: No hernia palpated on the left. Plan on laparoscopic [facility] repair with mesh.  Risks and benefits explained.  Possibility of open procedure explained.    Kathryne Eriksson. Dahlia Bailiff, MD, [place] [phone] [facility]

## 2017-05-23 NOTE — Anesthesia Procedure Notes (Signed)
Procedure Name: Intubation Date/Time: 05/23/2017 7:43 AM Performed by: Freddie Breech Pre-anesthesia Checklist: Patient identified, Emergency Drugs available, Suction available and Patient being monitored Patient Re-evaluated:Patient Re-evaluated prior to inductionOxygen Delivery Method: Circle System Utilized Preoxygenation: Pre-oxygenation with 100% oxygen Intubation Type: IV induction Ventilation: Mask ventilation without difficulty Laryngoscope Size: Mac and 4 Grade View: Grade I Tube type: Oral Tube size: 7.5 mm Number of attempts: 1 Airway Equipment and Method: Stylet and Oral airway Placement Confirmation: ETT inserted through vocal cords under direct vision,  positive ETCO2 and breath sounds checked- equal and bilateral Secured at: 23 cm Tube secured with: Tape Dental Injury: Teeth and Oropharynx as per pre-operative assessment

## 2017-05-23 NOTE — Discharge Instructions (Addendum)
Laparoscopic Inguinal Hernia Repair, Care After This sheet gives you information about how to care for yourself after your procedure. Your health care provider may also give you more specific instructions. If you have problems or questions, contact your health care provider. What can I expect after the procedure? After the procedure, it is common to have:  Pain, discomfort, or soreness.  Follow these instructions at home: Incision care  Follow instructions from your health care provider about how to take care of your incision. Make sure you: ? Wash your hands with soap and water before you change your bandage (dressing) or before you touch your abdomen. If soap and water are not available, use hand sanitizer. ? Change your dressing as told by your health care provider. ? Leave stitches (sutures), skin glue, or adhesive strips in place. These skin closures may need to stay in place for 2 weeks or longer. If adhesive strip edges start to loosen and curl up, you may trim the loose edges. Do not remove adhesive strips completely unless your health care provider tells you to do that.  Check your incision area every day for signs of infection. Check for: ? Redness, swelling, or pain. ? Fluid or blood. ? Warmth. ? Pus or a bad smells ? You may notice scrotal or penile ecchymosis or swelling,  This should not be tense or exquisitely tender.   Bathing  Do not take baths, swim, or use a hot tub until your health care provider approves. Ask your health care provider if you can take showers. You may only be allowed to take sponge baths for bathing.  Keep your bandage (dressing) dry until your health care provider says it can be removed. Activity  Do not lift anything that is heavier than 10 lb (4.5 kg) until your health care provider approves.  Do not drive or use heavy machinery while taking prescription pain medicine. Ask your health care provider when it is safe for you to drive or use heavy  machinery.  Do not drive for 24 hours if you were given a medicine to help you relax (sedative) during your procedure.  Rest as told by your health care provider. You may return to your normal activities when your health care provider approves. General instructions  Take over-the-counter and prescription medicines only as told by your health care provider.  To prevent or treat constipation while you are taking prescription pain medicine, your health care provider may recommend that you: ? Take over-the-counter or prescription medicines. ? Eat foods that are high in fiber, such as fresh fruits and vegetables, whole grains, and beans. ? Limit foods that are high in fat and processed sugars, such as fried and sweet foods.  Drink enough fluid to keep your urine clear or pale yellow.  Hold a pillow over your abdomen when you cough or sneeze. This helps with pain.  Keep all follow-up visits as told by your health care provider. This is important. Contact a health care provider if:  You have: ? A fever or chills. ? Redness, swelling, or pain around your incision. ? Fluid or blood coming from your incision. ? Pus or a bad smell coming from your incision. ? Pain that gets worse or does not get better with medicine. ? Nausea or vomiting. ? A cough. ? Shortness of breath.  Your incision feels warm to the touch.  You have not had a bowel movement in three days.  You are not able to urinate. Get help right  away if:  You have severe pain in your abdomen.  You have persistent nausea and vomiting.  You have redness, warmth, or pain in your leg.  You have chest pain.  You have trouble breathing. Summary  After this procedure, it is common to have pain, discomfort, or soreness.  Follow instructions from your health care provider about how to take care of your incision.  Check your incision area every day for signs of infection. Report any signs of infection to your health care  provider.  Keep all follow-up visits as told by your health care provider. This is important. This information is not intended to replace advice given to you by your health care provider. Make sure you discuss any questions you have with your health care provider. Document Released: 10/22/2012 Document Revised: 06/27/2016 Document Reviewed: 06/27/2016 Elsevier Interactive Patient Education  2018 [employer].

## 2017-05-23 NOTE — Anesthesia Preprocedure Evaluation (Signed)
Anesthesia Evaluation  Patient identified by MRN, date of birth, ID band Patient awake    Reviewed: Allergy & Precautions, NPO status , Patient's Chart, lab work & pertinent test results  History of Anesthesia Complications Negative for: history of anesthetic complications  Airway Mallampati: I  TM Distance: >3 FB Neck ROM: Full    Dental  (+) Teeth Intact   Pulmonary neg pulmonary ROS,    breath sounds clear to auscultation       Cardiovascular negative cardio ROS   Rhythm:Regular     Neuro/Psych negative neurological ROS  negative psych ROS   GI/Hepatic negative GI ROS, Neg liver ROS,   Endo/Other  negative endocrine ROS  Renal/GU negative Renal ROS     Musculoskeletal negative musculoskeletal ROS (+)   Abdominal   Peds  Hematology negative hematology ROS (+)   Anesthesia Other Findings   Reproductive/Obstetrics                             Anesthesia Physical Anesthesia Plan  ASA: I  Anesthesia Plan: General   Post-op Pain Management:    Induction: Intravenous  PONV Risk Score and Plan: 2 and Ondansetron and Dexamethasone  Airway Management Planned: Oral ETT  Additional Equipment: None  Intra-op Plan:   Post-operative Plan: Extubation in OR  Informed Consent: I have reviewed the patients History and Physical, chart, labs and discussed the procedure including the risks, benefits and alternatives for the proposed anesthesia with the patient or authorized representative who has indicated his/her understanding and acceptance.   Dental advisory given  Plan Discussed with: CRNA and Surgeon  Anesthesia Plan Comments:         Anesthesia Quick Evaluation  

## 2017-05-23 NOTE — Transfer of Care (Signed)
Immediate Anesthesia Transfer of Care Note  Patient: Justin Carrillo  Procedure(s) Performed: Procedure(s): LAPAROSCOPIC RIGHT INGUINAL HERNIA REPAIR WITH MESH (Right) INSERTION OF MESH (Right)  Patient Location: PACU  Anesthesia Type:General  Level of Consciousness: awake, oriented and patient cooperative  Airway & Oxygen Therapy: Patient Spontanous Breathing and Patient connected to face mask oxygen  Post-op Assessment: Report given to RN and Post -op Vital signs reviewed and stable  Post vital signs: Reviewed and stable  Last Vitals:  Vitals:   05/23/17 0543 05/23/17 0907  BP: 96/64   Pulse: (!) 52   Resp: 20   Temp: 36.6 C 36.9 C    Last Pain:  Vitals:   05/23/17 0907  TempSrc: Oral  PainSc:       Patients Stated Pain Goal: 1 (37/44/51 4604)  Complications: No apparent anesthesia complications

## 2017-05-24 ENCOUNTER — Encounter (HOSPITAL_COMMUNITY): Payer: Self-pay | Admitting: General Surgery

## 2017-05-24 NOTE — Anesthesia Postprocedure Evaluation (Signed)
Anesthesia Post Note  Patient: Justin Carrillo  Procedure(s) Performed: Procedure(s) (LRB): LAPAROSCOPIC RIGHT INGUINAL HERNIA REPAIR WITH MESH (Right) INSERTION OF MESH (Right)     Patient location during evaluation: PACU Anesthesia Type: General Level of consciousness: awake and alert Pain management: pain level controlled Vital Signs Assessment: post-procedure vital signs reviewed and stable Respiratory status: spontaneous breathing, nonlabored ventilation, respiratory function stable and patient connected to nasal cannula oxygen Cardiovascular status: blood pressure returned to baseline and stable Postop Assessment: no signs of nausea or vomiting Anesthetic complications: no    Last Vitals:  Vitals:   05/23/17 0940 05/23/17 0954  BP:  122/77  Pulse: 87 82  Resp: 17 16  Temp: (!) 36.2 C     Last Pain:  Vitals:   05/23/17 0925  TempSrc:   PainSc: 0-No pain                 Keonia Pasko

## 2017-12-11 ENCOUNTER — Ambulatory Visit: Payer: Self-pay | Admitting: *Deleted

## 2017-12-11 NOTE — Telephone Encounter (Signed)
Left v/m requesting cb.

## 2017-12-11 NOTE — Telephone Encounter (Signed)
Pt. returned call.  Denied any dizziness, weakness, or abdominal pain.  Advised to call back if he has any worsening symptoms of bleeding, or any of above symptoms.  Reminded of his appt. on Friday, 12/13/17 @ 9:00 AM.  Pt. Verb. Understanding and agrees with plan.

## 2017-12-11 NOTE — Telephone Encounter (Signed)
Attempted to contact pt regarding previous nurse triage; left message at [phone].  Copied from [place]. Topic: Quick Communication - Office Called Patient >> Dec 11, 2017 10:32 AM Helene Shoe, LPN wrote: Reason for CRM: see notes  >> Dec 11, 2017 10:33 AM Helene Shoe, LPN wrote: Glenda Chroman FNP will see pt but wants to know if pt needs to be seen sooner than 12/13/17. Does pt have any dizziness, weakness or abd pain; if so needs sooner 30' appt. OK for [facility] to speak with pt.

## 2017-12-11 NOTE — Telephone Encounter (Signed)
Patient has scheduled a new patient appointment- but he has had bleeding with his stools for a week now- he reports no constipation or diarrhea. He has had bleeding in the past-but has not had follow up in a year and is new to area.appointment given for Friday for evaluation of this problem only- patient is aware.  Reason for Disposition . MILD rectal bleeding (more than just a few drops or streaks)  Answer Assessment - Initial Assessment Questions 1. APPEARANCE of BLOOD: "What color is it?" "Is it passed separately, on the surface of the stool, or mixed in with the stool?"      Bright red- at times steaks in stools and at times spots in toilet 2. AMOUNT: "How much blood was passed?"      Sometimes very little- sometimes fair amount 3. FREQUENCY: "How many times has blood been passed with the stools?"      Not every time patient has BM- 1-2 times /day 4. ONSET: "When was the blood first seen in the stools?" (Days or weeks)      1 week- patient does have a past  5. DIARRHEA: "Is there also some diarrhea?" If so, ask: "How many diarrhea stools were passed in past 24 hours?"      no 6. CONSTIPATION: "Do you have constipation?" If so, "How bad is it?"     no 7. RECURRENT SYMPTOMS: "Have you had blood in your stools before?" If so, ask: "When was the last time?" and "What happened that time?"      Yes- on/off- for several years- has been evaluated- but not within the past year 8. BLOOD THINNERS: "Do you take any blood thinners?" (e.g., Coumadin/warfarin, Pradaxa/dabigatran, aspirin)     no 9. OTHER SYMPTOMS: "Do you have any other symptoms?"  (e.g., abdominal pain, vomiting, dizziness, fever)     no 10. PREGNANCY: "Is there any chance you are pregnant?" "When was your last menstrual period?"       n/a  Protocols used: RECTAL BLEEDING-A-AH

## 2017-12-11 NOTE — Telephone Encounter (Signed)
Left v/m requesting pt to cb to see if pt has had any weakness, dizziness or abd pain. If so needs sooner appt than 12/13/17. Pt will need 30' appt if needs sooner appt. CRM done so [facility] can speak with pt.

## 2017-12-11 NOTE — Telephone Encounter (Signed)
Left v/m requesting pt cb to [facility].

## 2017-12-13 ENCOUNTER — Encounter: Payer: Self-pay | Admitting: Family Medicine

## 2017-12-13 ENCOUNTER — Ambulatory Visit (INDEPENDENT_AMBULATORY_CARE_PROVIDER_SITE_OTHER): Payer: 59 | Admitting: Family Medicine

## 2017-12-13 VITALS — BP 118/74 | HR 54 | Temp 97.4°F | Wt 169.0 lb

## 2017-12-13 DIAGNOSIS — K921 Melena: Secondary | ICD-10-CM

## 2017-12-13 DIAGNOSIS — R195 Other fecal abnormalities: Secondary | ICD-10-CM

## 2017-12-13 LAB — CBC WITH DIFFERENTIAL/PLATELET
BASOS PCT: 1.1 % (ref 0.0–3.0)
Basophils Absolute: 0 10*3/uL (ref 0.0–0.1)
EOS PCT: 2.3 % (ref 0.0–5.0)
Eosinophils Absolute: 0.1 10*3/uL (ref 0.0–0.7)
HCT: 43 % (ref 39.0–52.0)
Hemoglobin: 14.6 g/dL (ref 13.0–17.0)
LYMPHS ABS: 1.7 10*3/uL (ref 0.7–4.0)
Lymphocytes Relative: 43.4 % (ref 12.0–46.0)
MCHC: 34 g/dL (ref 30.0–36.0)
MCV: 93.3 fl (ref 78.0–100.0)
MONO ABS: 0.4 10*3/uL (ref 0.1–1.0)
Monocytes Relative: 11.3 % (ref 3.0–12.0)
NEUTROS PCT: 41.9 % — AB (ref 43.0–77.0)
Neutro Abs: 1.6 10*3/uL (ref 1.4–7.7)
Platelets: 288 10*3/uL (ref 150.0–400.0)
RBC: 4.6 Mil/uL (ref 4.22–5.81)
RDW: 13.3 % (ref 11.5–15.5)
WBC: 3.9 10*3/uL — ABNORMAL LOW (ref 4.0–10.5)

## 2017-12-13 LAB — SEDIMENTATION RATE: Sed Rate: 6 mm/hr (ref 0–15)

## 2017-12-13 LAB — COMPREHENSIVE METABOLIC PANEL
ALT: 19 U/L (ref 0–53)
AST: 20 U/L (ref 0–37)
Albumin: 4.3 g/dL (ref 3.5–5.2)
Alkaline Phosphatase: 57 U/L (ref 39–117)
BUN: 18 mg/dL (ref 6–23)
CHLORIDE: 100 meq/L (ref 96–112)
CO2: 32 mEq/L (ref 19–32)
Calcium: 9.5 mg/dL (ref 8.4–10.5)
Creatinine, Ser: 1.15 mg/dL (ref 0.40–1.50)
GFR: 79.15 mL/min (ref >60.00)
GLUCOSE: 106 mg/dL — AB (ref 70–99)
POTASSIUM: 3.9 meq/L (ref 3.5–5.1)
Sodium: 138 mEq/L (ref 135–145)
Total Bilirubin: 0.6 mg/dL (ref 0.2–1.2)
Total Protein: 6.9 g/dL (ref 6.0–8.3)
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
## 2017-12-13 NOTE — Patient Instructions (Signed)
Please see Rosaria Ferries to schedule your GI consult  I will notify you of lab results

## 2017-12-17 ENCOUNTER — Ambulatory Visit (INDEPENDENT_AMBULATORY_CARE_PROVIDER_SITE_OTHER): Payer: 59 | Admitting: Gastroenterology

## 2017-12-17 ENCOUNTER — Encounter: Payer: Self-pay | Admitting: Gastroenterology

## 2017-12-17 ENCOUNTER — Other Ambulatory Visit: Payer: Self-pay

## 2017-12-17 VITALS — BP 104/66 | HR 66 | Ht 70.0 in | Wt 164.8 lb

## 2017-12-17 DIAGNOSIS — R195 Other fecal abnormalities: Secondary | ICD-10-CM | POA: Diagnosis not present

## 2017-12-17 DIAGNOSIS — K921 Melena: Secondary | ICD-10-CM
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
## 2017-12-23 ENCOUNTER — Other Ambulatory Visit: Payer: Self-pay

## 2017-12-23 DIAGNOSIS — R195 Other fecal abnormalities: Secondary | ICD-10-CM

## 2017-12-24 ENCOUNTER — Other Ambulatory Visit: Payer: Self-pay

## 2017-12-24 DIAGNOSIS — R195 Other fecal abnormalities: Secondary | ICD-10-CM

## 2017-12-24 DIAGNOSIS — Z31440 Encounter of male for testing for genetic disease carrier status for procreative management: Secondary | ICD-10-CM | POA: Diagnosis not present

## 2018-01-01 ENCOUNTER — Ambulatory Visit: Payer: 59 | Admitting: Family Medicine

## 2018-01-08 ENCOUNTER — Other Ambulatory Visit: Payer: Self-pay

## 2018-01-08 ENCOUNTER — Encounter: Payer: Self-pay | Admitting: *Deleted

## 2018-01-13 NOTE — Discharge Instructions (Signed)
General Anesthesia, Adult, Care After °These instructions provide you with information about caring for yourself after your procedure. Your health care provider may also give you more specific instructions. Your treatment has been planned according to current medical practices, but problems sometimes occur. Call your health care provider if you have any problems or questions after your procedure. °What can I expect after the procedure? °After the procedure, it is common to have: °· Vomiting. °· A sore throat. °· Mental slowness. ° °It is common to feel: °· Nauseous. °· Cold or shivery. °· Sleepy. °· Tired. °· Sore or achy, even in parts of your body where you did not have surgery. ° °Follow these instructions at home: °For at least 24 hours after the procedure: °· Do not: °? Participate in activities where you could fall or become injured. °? Drive. °? Use heavy machinery. °? Drink alcohol. °? Take sleeping pills or medicines that cause drowsiness. °? Make important decisions or sign legal documents. °? Take care of children on your own. °· Rest. °Eating and drinking °· If you vomit, drink water, juice, or soup when you can drink without vomiting. °· Drink enough fluid to keep your urine clear or pale yellow. °· Make sure you have little or no nausea before eating solid foods. °· Follow the diet recommended by your health care provider. °General instructions °· Have a responsible adult stay with you until you are awake and alert. °· Return to your normal activities as told by your health care provider. Ask your health care provider what activities are safe for you. °· Take over-the-counter and prescription medicines only as told by your health care provider. °· If you smoke, do not smoke without supervision. °· Keep all follow-up visits as told by your health care provider. This is important. °Contact a health care provider if: °· You continue to have nausea or vomiting at home, and medicines are not helpful. °· You  cannot drink fluids or start eating again. °· You cannot urinate after 8-12 hours. °· You develop a skin rash. °· You have fever. °· You have increasing redness at the site of your procedure. °Get help right away if: °· You have difficulty breathing. °· You have chest pain. °· You have unexpected bleeding. °· You feel that you are having a life-threatening or urgent problem. °This information is not intended to replace advice given to you by your health care provider. Make sure you discuss any questions you have with your health care provider. °Document Released: 02/11/2001 Document Revised: 04/09/2016 Document Reviewed: 10/20/2015 °Elsevier Interactive Patient Education © 2018 Elsevier Inc. ° °

## 2018-01-14 ENCOUNTER — Ambulatory Visit
Admission: RE | Admit: 2018-01-14 | Discharge: 2018-01-14 | Disposition: A | Discharge status: Home / Self Care | Payer: 59 | Source: Ambulatory Visit | Attending: Gastroenterology | Admitting: Gastroenterology

## 2018-01-14 ENCOUNTER — Ambulatory Visit: Admit: 2018-01-14 | Payer: 59 | Admitting: Gastroenterology

## 2018-01-14 ENCOUNTER — Encounter
Admission: RE | Disposition: A | Discharge status: Home / Self Care | Payer: Self-pay | Source: Ambulatory Visit | Attending: Gastroenterology

## 2018-01-14 ENCOUNTER — Ambulatory Visit: Payer: 59 | Admitting: Anesthesiology

## 2018-01-14 DIAGNOSIS — K6389 Other specified diseases of intestine: Secondary | ICD-10-CM | POA: Diagnosis not present

## 2018-01-14 DIAGNOSIS — K5730 Diverticulosis of large intestine without perforation or abscess without bleeding: Secondary | ICD-10-CM | POA: Insufficient documentation

## 2018-01-14 DIAGNOSIS — R195 Other fecal abnormalities: Secondary | ICD-10-CM | POA: Diagnosis not present

## 2018-01-14 DIAGNOSIS — K648 Other hemorrhoids: Secondary | ICD-10-CM | POA: Diagnosis not present

## 2018-01-14 DIAGNOSIS — Z8249 Family history of ischemic heart disease and other diseases of the circulatory system: Secondary | ICD-10-CM | POA: Insufficient documentation

## 2018-01-14 DIAGNOSIS — F909 Attention-deficit hyperactivity disorder, unspecified type: Secondary | ICD-10-CM | POA: Diagnosis not present

## 2018-01-14 DIAGNOSIS — E785 Hyperlipidemia, unspecified: Secondary | ICD-10-CM | POA: Insufficient documentation

## 2018-01-14 HISTORY — PX: COLONOSCOPY WITH PROPOFOL: SHX5780

## 2018-01-14 SURGERY — COLONOSCOPY WITH PROPOFOL
Anesthesia: General

## 2018-01-14 SURGERY — COLONOSCOPY WITH PROPOFOL
Anesthesia: General | Wound class: Clean Contaminated

## 2018-01-14 MED ORDER — ACETAMINOPHEN 160 MG/5ML PO SOLN: 325.0000 mg | Freq: Once | ORAL | Status: DC

## 2018-01-14 MED ORDER — STERILE WATER FOR IRRIGATION IR SOLN
Status: DC | PRN
  Administered 2018-01-14: 11:00:00

## 2018-01-14 MED ORDER — SODIUM CHLORIDE 0.9 % IV SOLN
INTRAVENOUS | Status: DC | PRN
  Administered 2018-01-14 (×2): 100 ug via INTRAVENOUS
  Administered 2018-01-14: 50 ug via INTRAVENOUS

## 2018-01-14 MED ORDER — SODIUM CHLORIDE 0.9 % IV SOLN: INTRAVENOUS | Status: DC

## 2018-01-14 MED ORDER — PROPOFOL 10 MG/ML IV BOLUS
INTRAVENOUS | Status: DC | PRN
  Administered 2018-01-14: 50 mg via INTRAVENOUS
  Administered 2018-01-14: 90 mg via INTRAVENOUS
  Administered 2018-01-14: 50 mg via INTRAVENOUS
  Administered 2018-01-14: 140 mg via INTRAVENOUS
  Administered 2018-01-14: 60 mg via INTRAVENOUS
  Administered 2018-01-14: 90 mg via INTRAVENOUS
  Administered 2018-01-14 (×2): 50 mg via INTRAVENOUS

## 2018-01-14 MED ORDER — ACETAMINOPHEN 325 MG PO TABS: 325.0000 mg | ORAL_TABLET | Freq: Once | ORAL | Status: DC

## 2018-01-14 MED ORDER — LACTATED RINGERS IV SOLN
INTRAVENOUS | Status: DC
  Administered 2018-01-14 (×2): via INTRAVENOUS

## 2018-01-14 SURGICAL SUPPLY — 24 items
CANISTER SUCT 1200ML W/VALVE (MISCELLANEOUS) ×2 IMPLANT
CLIP HMST 235XBRD CATH ROT (MISCELLANEOUS) IMPLANT
CLIP RESOLUTION 360 11X235 (MISCELLANEOUS)
ELECT REM PT RETURN 9FT ADLT (ELECTROSURGICAL)
ELECTRODE REM PT RTRN 9FT ADLT (ELECTROSURGICAL) IMPLANT
FCP ESCP3.2XJMB 240X2.8X (MISCELLANEOUS)
FORCEPS BIOP RAD 4 LRG CAP 4 (CUTTING FORCEPS) ×2 IMPLANT
FORCEPS BIOP RJ4 240 W/NDL (MISCELLANEOUS)
FORCEPS ESCP3.2XJMB 240X2.8X (MISCELLANEOUS) IMPLANT
GOWN CVR UNV OPN BCK APRN NK (MISCELLANEOUS) ×2 IMPLANT
GOWN ISOL THUMB LOOP REG UNIV (MISCELLANEOUS) ×2
INJECTOR VARIJECT VIN23 (MISCELLANEOUS) IMPLANT
KIT DEFENDO VALVE AND CONN (KITS) IMPLANT
KIT ENDO PROCEDURE OLY (KITS) ×2 IMPLANT
MARKER SPOT ENDO TATTOO 5ML (MISCELLANEOUS) IMPLANT
PROBE APC STR FIRE (PROBE) IMPLANT
RETRIEVER NET ROTH 2.5X230 LF (MISCELLANEOUS) IMPLANT
SNARE SHORT THROW 13M SML OVAL (MISCELLANEOUS) IMPLANT
SNARE SHORT THROW 30M LRG OVAL (MISCELLANEOUS) IMPLANT
SNARE SNG USE RND 15MM (INSTRUMENTS) IMPLANT
SPOT EX ENDOSCOPIC TATTOO (MISCELLANEOUS)
TRAP ETRAP POLY (MISCELLANEOUS) IMPLANT
VARIJECT INJECTOR VIN23 (MISCELLANEOUS)
WATER STERILE IRR 250ML POUR (IV SOLUTION) ×2 IMPLANT
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
## 2018-01-14 NOTE — Op Note (Signed)
[facility] Patient Name: Justin Carrillo Procedure Date: 01/14/2018 9:45 AM MRN: [id number] Account #: [id number] Date of Birth: 14-Nov-1986 Admit Type: Outpatient Age: 32 Room: [facility] OR ROOM 01 Gender: Male Note Status: Finalized Procedure:            Colonoscopy Indications:          Positive fecal immunochemical test Providers:            Ari Engelbrecht B. Bonna Gains MD, MD Referring MD:         Elby Beck (Referring MD) Medicines:            Monitored Anesthesia Care Complications:        No immediate complications. Procedure:            Pre-Anesthesia Assessment:                       - ASA Grade Assessment: II - A patient with mild                        systemic disease.                       - Prior to the procedure, a History and Physical was                        performed, and patient medications, allergies and                        sensitivities were reviewed. The patient's tolerance of                        previous anesthesia was reviewed.                       After obtaining informed consent, the colonoscope was                        passed under direct vision. Throughout the procedure,                        the patient's blood pressure, pulse, and oxygen                        saturations were monitored continuously. The Olympus                        Colonoscope [id number]) was introduced through the                        anus and advanced to the the terminal ileum. The                        colonoscopy was performed with ease. The patient                        tolerated the procedure well. The quality of the bowel                        preparation was good. Findings:      The perianal and digital rectal examinations were normal.      The  terminal ileum appeared normal. This was biopsied with a cold       forceps for histology.      Multiple small-mouthed diverticula were found in the sigmoid colon.      The rectum,  sigmoid colon, descending colon, transverse colon, ascending       colon and cecum appeared normal. This was biopsied with a cold forceps       for histology.      The entire examined colon appeared normal.      Internal hemorrhoids were found during retroflexion. Impression:           - The examined portion of the ileum was normal.                        Biopsied.                       - The rectum, sigmoid colon, descending colon,                        transverse colon, ascending colon and cecum are normal.                        Biopsied.                       - The entire examined colon is normal.                       - Internal hemorrhoids. Recommendation:       - Discharge patient to home (with escort).                       - Advance diet as tolerated.                       - Continue present medications.                       - Await pathology results.                       - The findings and recommendations were discussed with                        the patient.                       - Return to primary care physician as previously                        scheduled.                       - High fiber diet. Procedure Code(s):    --- Professional ---                       [phone], Colonoscopy, flexible; with biopsy, single or                        multiple Diagnosis Code(s):    --- Professional ---                       K64.8, Other hemorrhoids  R19.5, Other fecal abnormalities CPT copyright 2016 American Medical Association. All rights reserved. The codes documented in this report are preliminary and upon coder review may  be revised to meet current compliance requirements.  Vonda Antigua, MD Margretta Sidle B. Bonna Gains MD, MD 01/14/2018 11:27:00 AM This report has been signed electronically. Number of Addenda: 0 Note Initiated On: 01/14/2018 9:45 AM Scope Withdrawal Time: 0 hours 15 minutes 36 seconds  Total Procedure Duration: 0 hours 31 minutes 4  seconds  Estimated Blood Loss: Estimated blood loss: none.      [facility]

## 2018-01-14 NOTE — Anesthesia Preprocedure Evaluation (Signed)
Anesthesia Evaluation  Patient identified by MRN, date of birth, ID band Patient awake    Reviewed: Allergy & Precautions, H&P , NPO status , Patient's Chart, lab work & pertinent test results  Airway Mallampati: I  TM Distance: >3 FB Neck ROM: full    Dental no notable dental hx.    Pulmonary    Pulmonary exam normal breath sounds clear to auscultation       Cardiovascular Normal cardiovascular exam Rhythm:regular Rate:Normal     Neuro/Psych    GI/Hepatic   Endo/Other    Renal/GU      Musculoskeletal   Abdominal   Peds  Hematology   Anesthesia Other Findings   Reproductive/Obstetrics                             Anesthesia Physical Anesthesia Plan  ASA: II  Anesthesia Plan: General   Post-op Pain Management:    Induction: Intravenous  PONV Risk Score and Plan: 2 and Treatment may vary due to age or medical condition and Propofol infusion  Airway Management Planned: Natural Airway  Additional Equipment:   Intra-op Plan:   Post-operative Plan:   Informed Consent: I have reviewed the patients History and Physical, chart, labs and discussed the procedure including the risks, benefits and alternatives for the proposed anesthesia with the patient or authorized representative who has indicated his/her understanding and acceptance.     Plan Discussed with: CRNA  Anesthesia Plan Comments:         Anesthesia Quick Evaluation

## 2018-01-14 NOTE — Anesthesia Procedure Notes (Signed)
Performed by: Nyoka Cowden, CRNA Pre-anesthesia Checklist: Patient identified, Patient being monitored and Timeout performed Patient Re-evaluated:Patient Re-evaluated prior to induction Preoxygenation: Pre-oxygenation with 100% oxygen Induction Type: IV induction

## 2018-01-14 NOTE — Transfer of Care (Signed)
Immediate Anesthesia Transfer of Care Note  Patient: Justin Carrillo  Procedure(s) Performed: COLONOSCOPY WITH PROPOFOL (N/A )  Patient Location: PACU  Anesthesia Type: General  Level of Consciousness: awake, alert  and patient cooperative  Airway and Oxygen Therapy: Patient Spontanous Breathing and Patient connected to supplemental oxygen  Post-op Assessment: Post-op Vital signs reviewed, Patient's Cardiovascular Status Stable, Respiratory Function Stable, Patent Airway and No signs of Nausea or vomiting  Post-op Vital Signs: Reviewed and stable  Complications: No apparent anesthesia complications

## 2018-01-14 NOTE — Anesthesia Postprocedure Evaluation (Signed)
Anesthesia Post Note  Patient: MODESTO GANOE  Procedure(s) Performed: COLONOSCOPY WITH PROPOFOL (N/A )  Patient location during evaluation: PACU Anesthesia Type: General Level of consciousness: awake and alert and oriented Pain management: satisfactory to patient Vital Signs Assessment: post-procedure vital signs reviewed and stable Respiratory status: spontaneous breathing, nonlabored ventilation and respiratory function stable Cardiovascular status: blood pressure returned to baseline and stable Postop Assessment: Adequate PO intake and No signs of nausea or vomiting Anesthetic complications: no    Raliegh Ip

## 2018-01-16 ENCOUNTER — Encounter: Payer: Self-pay | Admitting: Gastroenterology

## 2018-05-30 ENCOUNTER — Encounter: Payer: Self-pay | Admitting: Family Medicine

## 2018-05-30 ENCOUNTER — Ambulatory Visit (INDEPENDENT_AMBULATORY_CARE_PROVIDER_SITE_OTHER): Payer: 59 | Admitting: Family Medicine

## 2018-05-30 VITALS — BP 128/80 | HR 79 | Temp 98.3°F | Ht 70.0 in | Wt 158.5 lb

## 2018-05-30 DIAGNOSIS — L240 Irritant contact dermatitis due to detergents: Secondary | ICD-10-CM

## 2018-05-30 DIAGNOSIS — E7800 Pure hypercholesterolemia, unspecified: Secondary | ICD-10-CM | POA: Diagnosis not present

## 2018-05-30 LAB — LIPID PANEL
Cholesterol: 180 mg/dL (ref 0–200)
HDL: 61.9 mg/dL (ref >39.00)
LDL CALC: 110 mg/dL — AB (ref 0–99)
NONHDL: 118.23
Total CHOL/HDL Ratio: 3
Triglycerides: 42 mg/dL (ref 0.0–149.0)
VLDL: 8.4 mg/dL (ref 0.0–40.0)
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
## 2018-05-30 NOTE — Patient Instructions (Signed)
Good to see you today  Can use Amlactin lotion on bumps

## 2019-01-15 IMAGING — US US PELVIS LIMITED
1 series · 7 of 7 positions shown · non-contrast
Comparison: None.

CLINICAL DATA: Three-day history of right groin pain, worse with
sitting

EXAM:
LIMITED ULTRASOUND OF PELVIS/ULTRASOUND RIGHT INGUINAL REGION
TECHNIQUE: Longitudinal and transverse images of the right inguinal region
obtained.

[Series 1: us pelvis limited · 0.07mm/px · 7 acquisitions, 7 frames shown]
[im 1/7]
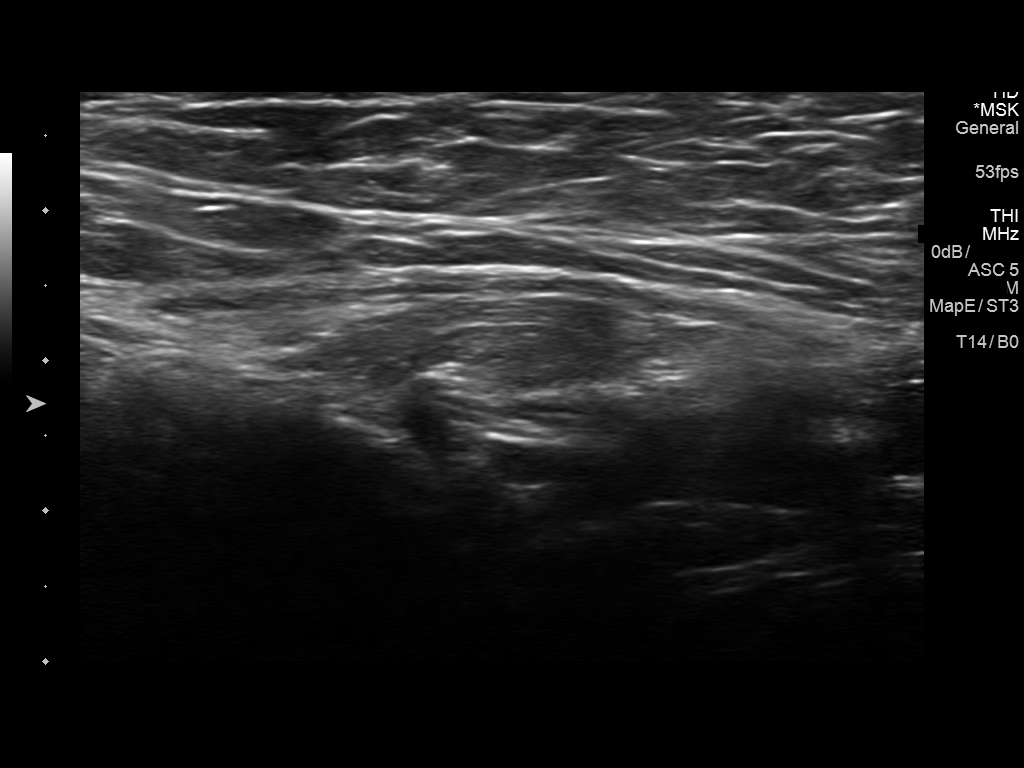
[im 2/7]
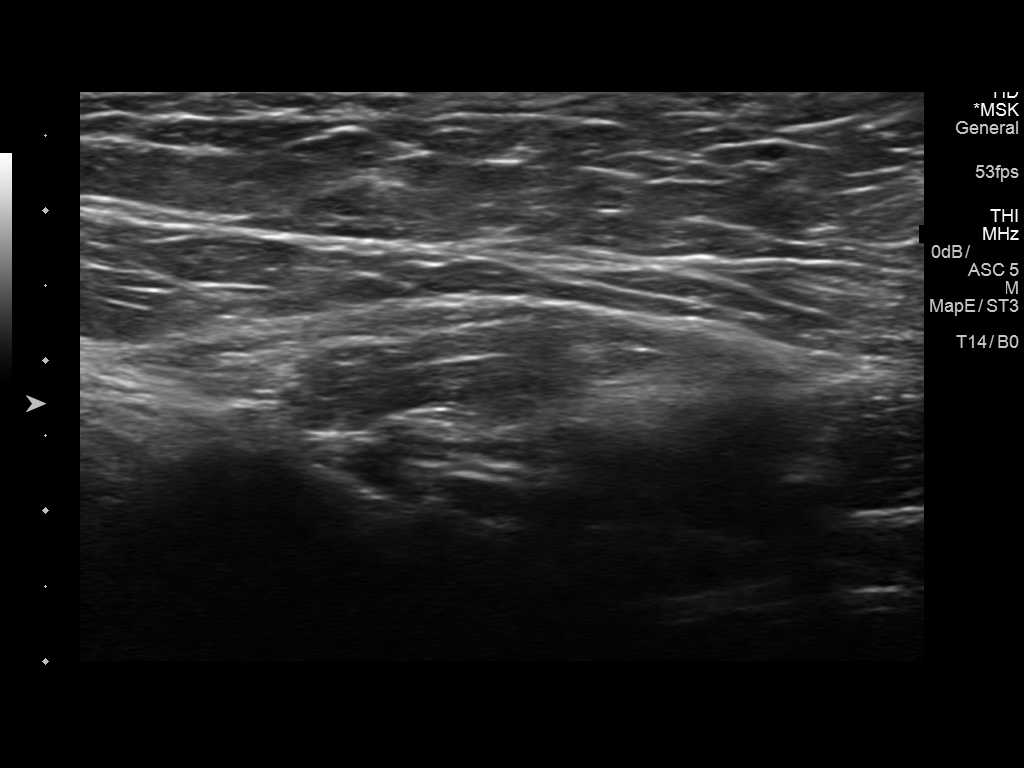
[im 3/7]
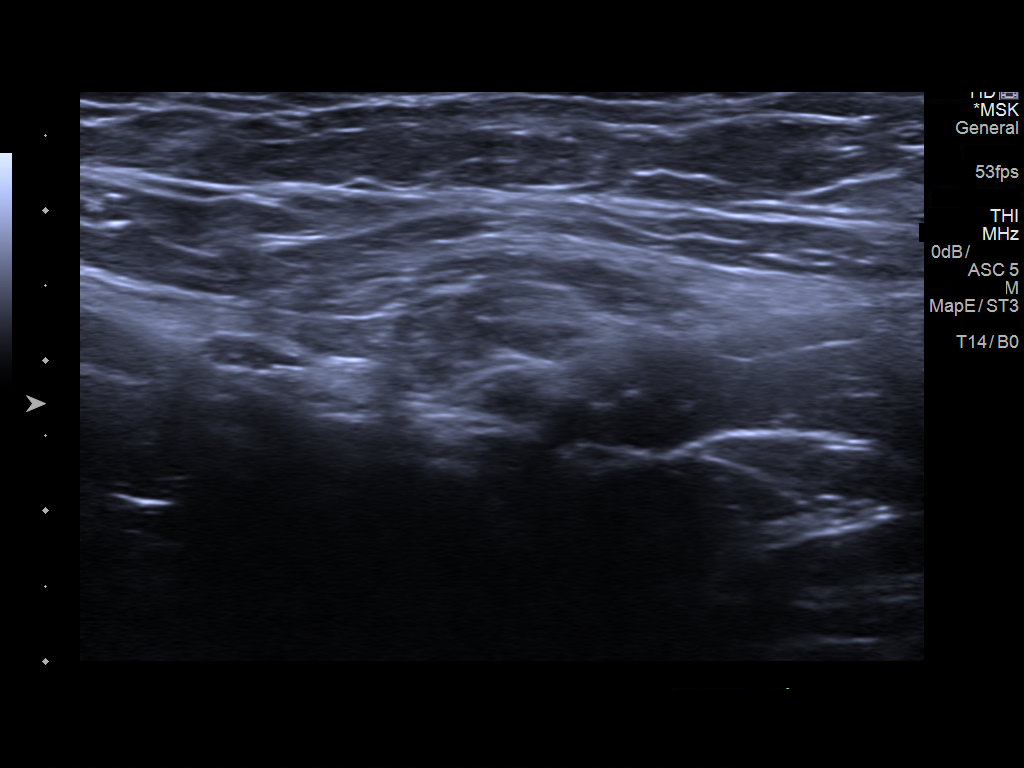
[im 4/7]
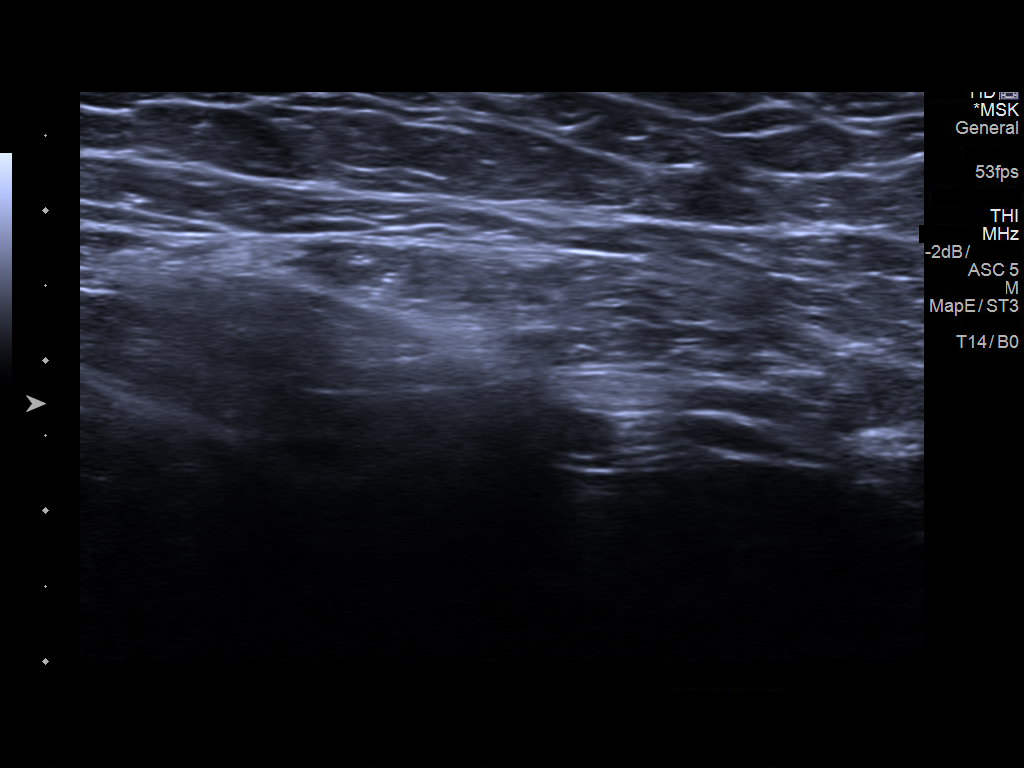
[im 5/7]
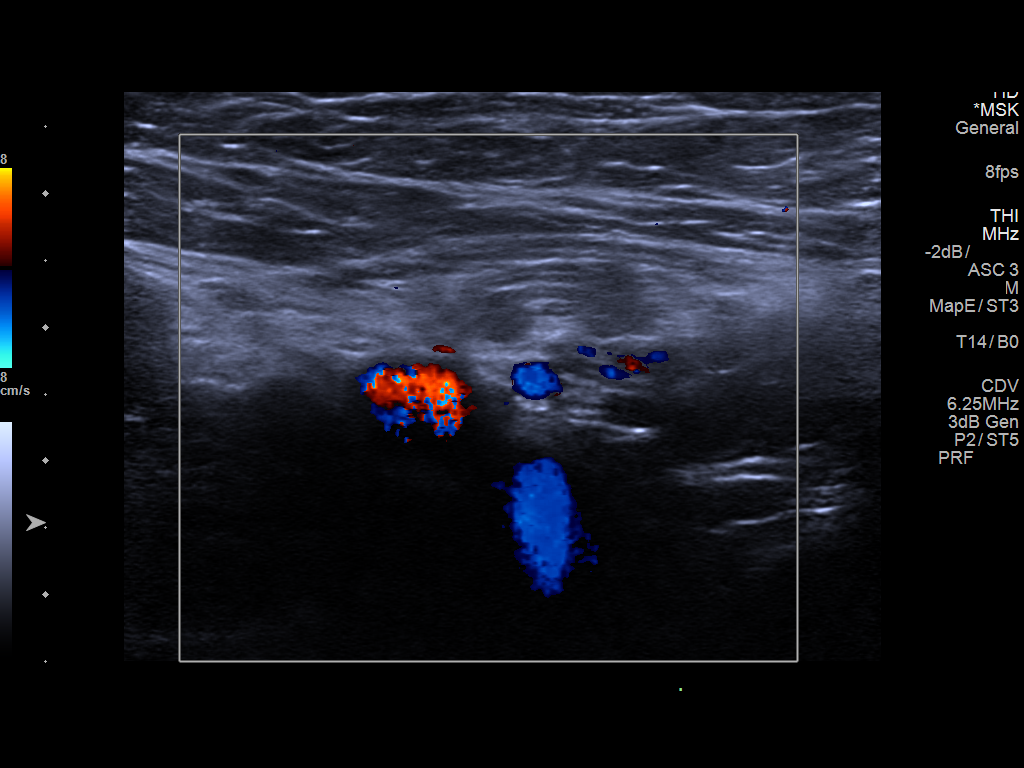
[im 6/7]
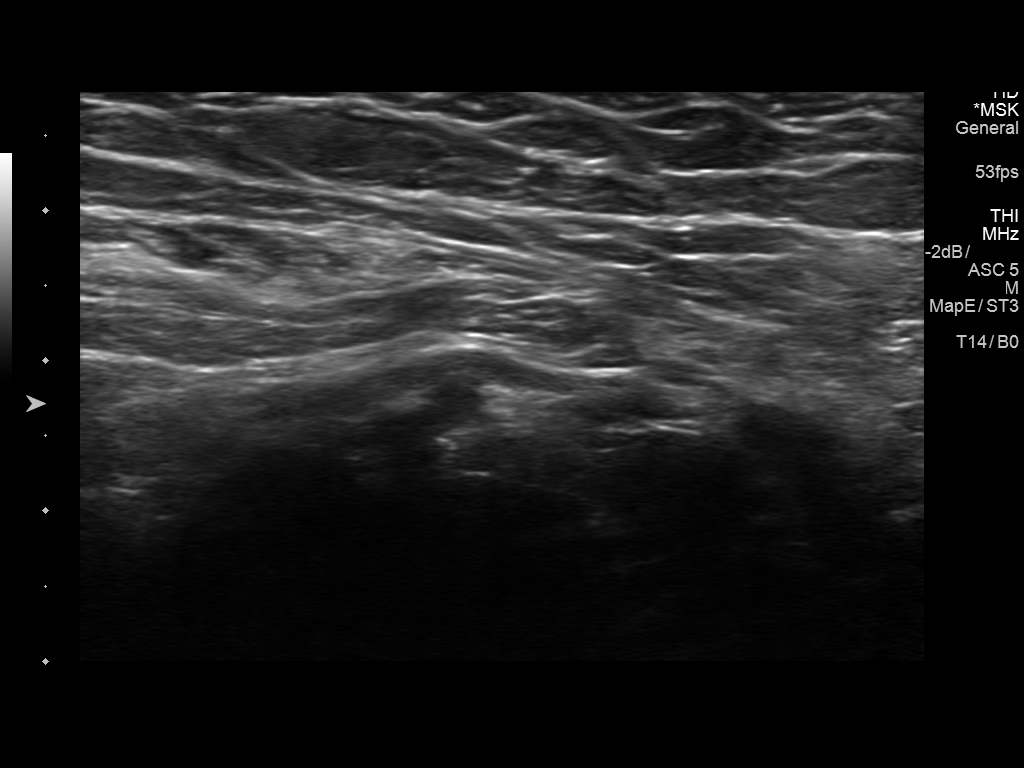
[im 7/7]
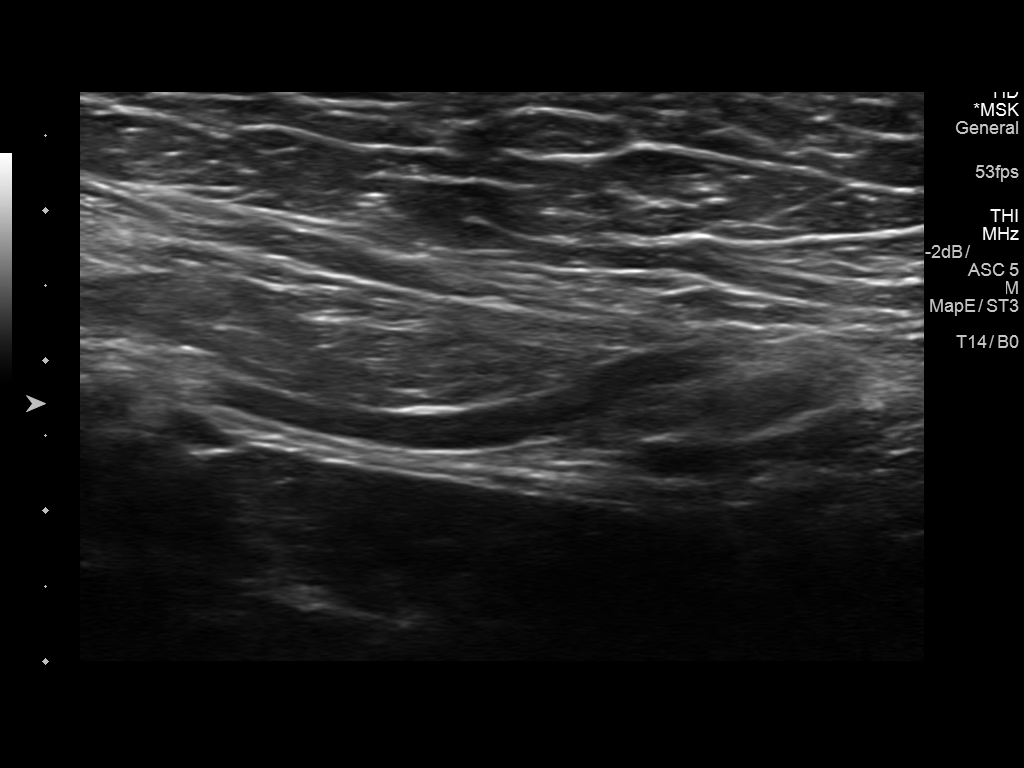

[7 of 7 positions shown; findings below may reference images not displayed]

FINDINGS: There is localized bowel in the right inguinal region, felt to most
likely represent bowel within a small right inguinal hernia. There
is no mass or inflammatory focus in this area. No adenopathy or
abnormal fluid. Study otherwise appears unremarkable.
IMPRESSION: Bowel is noted in the right inguinal region, moving with Valsalva
maneuver. It is felt that there is a small right inguinal hernia
containing bowel. Study otherwise unremarkable.

As clinically indicated, CT of the pelvis with oral contrast with
particular attention to the right inguinal region could be helpful
for more precise delineation of this area.

## 2022-04-10 ENCOUNTER — Telehealth: Payer: Self-pay | Admitting: Gastroenterology

## 2022-04-10 NOTE — Telephone Encounter (Signed)
Medical records were sent to [facility] on 05/23./2023
